# Patient Record
Sex: Female | Born: 1965 | Race: White | Hispanic: No | Marital: Married | Smoking: Former smoker
Health system: Southern US, Community
[De-identification: ages and names within clinical notes are randomized; demographics above are authoritative.]

## PROBLEM LIST (undated history)

## (undated) DIAGNOSIS — F32A Depression, unspecified: Secondary | ICD-10-CM

## (undated) DIAGNOSIS — F329 Major depressive disorder, single episode, unspecified: Secondary | ICD-10-CM

## (undated) DIAGNOSIS — F419 Anxiety disorder, unspecified: Secondary | ICD-10-CM

## (undated) HISTORY — PX: ABDOMINAL HYSTERECTOMY: SHX81

---
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
## 2006-06-09 ENCOUNTER — Emergency Department: Payer: Self-pay | Admitting: Unknown Physician Specialty

## 2008-01-31 ENCOUNTER — Emergency Department: Payer: Self-pay | Admitting: Emergency Medicine

## 2011-01-26 ENCOUNTER — Ambulatory Visit: Payer: Self-pay | Admitting: Family Medicine

## 2011-05-04 ENCOUNTER — Ambulatory Visit: Payer: Self-pay | Admitting: Gastroenterology

## 2011-05-05 ENCOUNTER — Ambulatory Visit: Payer: Self-pay | Admitting: Family Medicine

## 2011-05-06 LAB — PATHOLOGY REPORT

## 2011-07-26 ENCOUNTER — Ambulatory Visit: Payer: Self-pay | Admitting: Family Medicine

## 2012-08-03 ENCOUNTER — Ambulatory Visit: Payer: Self-pay | Admitting: Obstetrics and Gynecology

## 2014-03-04 ENCOUNTER — Ambulatory Visit: Payer: Self-pay | Admitting: Unknown Physician Specialty

## 2014-03-14 ENCOUNTER — Emergency Department: Payer: Self-pay | Admitting: Emergency Medicine

## 2014-03-14 LAB — COMPREHENSIVE METABOLIC PANEL
Albumin: 4 g/dL (ref 3.4–5.0)
Alkaline Phosphatase: 74 U/L
Anion Gap: 6 — ABNORMAL LOW (ref 7–16)
BUN: 8 mg/dL (ref 7–18)
Bilirubin,Total: 0.2 mg/dL (ref 0.2–1.0)
Calcium, Total: 8.8 mg/dL (ref 8.5–10.1)
Chloride: 108 mmol/L — ABNORMAL HIGH (ref 98–107)
Co2: 23 mmol/L (ref 21–32)
Creatinine: 1.03 mg/dL (ref 0.60–1.30)
EGFR (African American): >60
EGFR (Non-African Amer.): >60
Glucose: 143 mg/dL — ABNORMAL HIGH (ref 65–99)
Osmolality: 275 (ref 275–301)
Potassium: 4.2 mmol/L (ref 3.5–5.1)
SGOT(AST): 25 U/L (ref 15–37)
SGPT (ALT): 22 U/L (ref 12–78)
Sodium: 137 mmol/L (ref 136–145)
Total Protein: 7.8 g/dL (ref 6.4–8.2)

## 2014-03-14 LAB — URINALYSIS, COMPLETE
Bilirubin,UR: NEGATIVE
Blood: NEGATIVE
Glucose,UR: NEGATIVE mg/dL (ref 0–75)
Ketone: NEGATIVE
Leukocyte Esterase: NEGATIVE
Nitrite: NEGATIVE
Ph: 5 (ref 4.5–8.0)
Protein: NEGATIVE
RBC,UR: 2 /HPF (ref 0–5)
Specific Gravity: 1.02 (ref 1.003–1.030)
Squamous Epithelial: 6
WBC UR: 2 /HPF (ref 0–5)

## 2014-03-14 LAB — CBC WITH DIFFERENTIAL/PLATELET
Basophil #: 0.2 10*3/uL — ABNORMAL HIGH (ref 0.0–0.1)
Basophil %: 1.6 %
Eosinophil #: 0.1 10*3/uL (ref 0.0–0.7)
Eosinophil %: 0.6 %
HCT: 40.3 % (ref 35.0–47.0)
HGB: 13.6 g/dL (ref 12.0–16.0)
Lymphocyte #: 1.7 10*3/uL (ref 1.0–3.6)
Lymphocyte %: 13.1 %
MCH: 31.3 pg (ref 26.0–34.0)
MCHC: 33.7 g/dL (ref 32.0–36.0)
MCV: 93 fL (ref 80–100)
Monocyte #: 0.7 x10 3/mm (ref 0.2–0.9)
Monocyte %: 5.1 %
Neutrophil #: 10.3 10*3/uL — ABNORMAL HIGH (ref 1.4–6.5)
Neutrophil %: 79.6 %
Platelet: 373 10*3/uL (ref 150–440)
RBC: 4.34 10*6/uL (ref 3.80–5.20)
RDW: 13.5 % (ref 11.5–14.5)
WBC: 12.9 10*3/uL — ABNORMAL HIGH (ref 3.6–11.0)

## 2014-03-14 LAB — LIPASE, BLOOD: Lipase: 182 U/L (ref 73–393)

## 2014-05-08 ENCOUNTER — Ambulatory Visit: Payer: Self-pay | Admitting: Gynecologic Oncology

## 2014-05-18 ENCOUNTER — Emergency Department: Payer: Self-pay | Admitting: Emergency Medicine

## 2014-05-18 LAB — URINALYSIS, COMPLETE
Bilirubin,UR: NEGATIVE
Blood: NEGATIVE
Glucose,UR: NEGATIVE mg/dL (ref 0–75)
Ketone: NEGATIVE
Leukocyte Esterase: NEGATIVE
Nitrite: NEGATIVE
Ph: 5 (ref 4.5–8.0)
Protein: NEGATIVE
RBC,UR: 3 /HPF (ref 0–5)
Specific Gravity: 1.023 (ref 1.003–1.030)
Squamous Epithelial: 22
WBC UR: 5 /HPF (ref 0–5)

## 2014-05-18 LAB — COMPREHENSIVE METABOLIC PANEL
Albumin: 3.6 g/dL (ref 3.4–5.0)
Alkaline Phosphatase: 46 U/L
Anion Gap: 10 (ref 7–16)
BUN: 8 mg/dL (ref 7–18)
Bilirubin,Total: 0.4 mg/dL (ref 0.2–1.0)
Calcium, Total: 8.7 mg/dL (ref 8.5–10.1)
Chloride: 104 mmol/L (ref 98–107)
Co2: 24 mmol/L (ref 21–32)
Creatinine: 0.93 mg/dL (ref 0.60–1.30)
EGFR (African American): >60
EGFR (Non-African Amer.): >60
Glucose: 100 mg/dL — ABNORMAL HIGH (ref 65–99)
Osmolality: 274 (ref 275–301)
Potassium: 3.8 mmol/L (ref 3.5–5.1)
SGOT(AST): 18 U/L (ref 15–37)
SGPT (ALT): 16 U/L (ref 12–78)
Sodium: 138 mmol/L (ref 136–145)
Total Protein: 8 g/dL (ref 6.4–8.2)

## 2014-05-18 LAB — LIPASE, BLOOD: Lipase: 106 U/L (ref 73–393)

## 2014-05-18 LAB — CBC WITH DIFFERENTIAL/PLATELET
Basophil #: 0.1 10*3/uL (ref 0.0–0.1)
Basophil %: 0.5 %
Eosinophil #: 0.1 10*3/uL (ref 0.0–0.7)
Eosinophil %: 0.9 %
HCT: 40.1 % (ref 35.0–47.0)
HGB: 13.1 g/dL (ref 12.0–16.0)
Lymphocyte #: 2.6 10*3/uL (ref 1.0–3.6)
Lymphocyte %: 22.1 %
MCH: 31 pg (ref 26.0–34.0)
MCHC: 32.7 g/dL (ref 32.0–36.0)
MCV: 95 fL (ref 80–100)
Monocyte #: 0.7 x10 3/mm (ref 0.2–0.9)
Monocyte %: 5.9 %
Neutrophil #: 8.2 10*3/uL — ABNORMAL HIGH (ref 1.4–6.5)
Neutrophil %: 70.6 %
Platelet: 376 10*3/uL (ref 150–440)
RBC: 4.23 10*6/uL (ref 3.80–5.20)
RDW: 13.6 % (ref 11.5–14.5)
WBC: 11.6 10*3/uL — ABNORMAL HIGH (ref 3.6–11.0)

## 2014-05-20 ENCOUNTER — Inpatient Hospital Stay: Payer: Self-pay | Admitting: Obstetrics and Gynecology

## 2014-05-20 LAB — CBC WITH DIFFERENTIAL/PLATELET
Basophil #: 0.1 10*3/uL (ref 0.0–0.1)
Basophil %: 0.7 %
Eosinophil #: 0.1 10*3/uL (ref 0.0–0.7)
Eosinophil %: 0.8 %
HCT: 35.8 % (ref 35.0–47.0)
HGB: 11.5 g/dL — ABNORMAL LOW (ref 12.0–16.0)
Lymphocyte #: 2 10*3/uL (ref 1.0–3.6)
Lymphocyte %: 22.1 %
MCH: 31 pg (ref 26.0–34.0)
MCHC: 32.3 g/dL (ref 32.0–36.0)
MCV: 96 fL (ref 80–100)
Monocyte #: 0.7 x10 3/mm (ref 0.2–0.9)
Monocyte %: 7.8 %
Neutrophil #: 6.2 10*3/uL (ref 1.4–6.5)
Neutrophil %: 68.6 %
Platelet: 351 10*3/uL (ref 150–440)
RBC: 3.73 10*6/uL — ABNORMAL LOW (ref 3.80–5.20)
RDW: 13.6 % (ref 11.5–14.5)
WBC: 9.1 10*3/uL (ref 3.6–11.0)

## 2014-05-20 LAB — COMPREHENSIVE METABOLIC PANEL
Albumin: 3.3 g/dL — ABNORMAL LOW (ref 3.4–5.0)
Alkaline Phosphatase: 39 U/L — ABNORMAL LOW
Anion Gap: 7 (ref 7–16)
BUN: 6 mg/dL — ABNORMAL LOW (ref 7–18)
Bilirubin,Total: 0.2 mg/dL (ref 0.2–1.0)
Calcium, Total: 8.2 mg/dL — ABNORMAL LOW (ref 8.5–10.1)
Chloride: 108 mmol/L — ABNORMAL HIGH (ref 98–107)
Co2: 25 mmol/L (ref 21–32)
Creatinine: 0.9 mg/dL (ref 0.60–1.30)
EGFR (African American): >60
EGFR (Non-African Amer.): >60
Glucose: 74 mg/dL (ref 65–99)
Osmolality: 276 (ref 275–301)
Potassium: 3.6 mmol/L (ref 3.5–5.1)
SGOT(AST): 17 U/L (ref 15–37)
SGPT (ALT): 16 U/L (ref 12–78)
Sodium: 140 mmol/L (ref 136–145)
Total Protein: 7.3 g/dL (ref 6.4–8.2)

## 2014-05-21 LAB — URINALYSIS, COMPLETE
Bilirubin,UR: NEGATIVE
Glucose,UR: NEGATIVE mg/dL (ref 0–75)
Ketone: NEGATIVE
Nitrite: NEGATIVE
Ph: 5 (ref 4.5–8.0)
Protein: NEGATIVE
RBC,UR: 4 /HPF (ref 0–5)
Specific Gravity: 1.013 (ref 1.003–1.030)
Squamous Epithelial: 30
WBC UR: 24 /HPF (ref 0–5)

## 2014-05-21 LAB — CBC WITH DIFFERENTIAL/PLATELET
Basophil #: 0.1 10*3/uL (ref 0.0–0.1)
Basophil %: 0.5 %
Eosinophil #: 0.1 10*3/uL (ref 0.0–0.7)
Eosinophil %: 1.4 %
HCT: 32.7 % — ABNORMAL LOW (ref 35.0–47.0)
HGB: 10.9 g/dL — ABNORMAL LOW (ref 12.0–16.0)
Lymphocyte #: 1.9 10*3/uL (ref 1.0–3.6)
Lymphocyte %: 19.2 %
MCH: 31.8 pg (ref 26.0–34.0)
MCHC: 33.4 g/dL (ref 32.0–36.0)
MCV: 95 fL (ref 80–100)
Monocyte #: 0.7 x10 3/mm (ref 0.2–0.9)
Monocyte %: 7.3 %
Neutrophil #: 7.2 10*3/uL — ABNORMAL HIGH (ref 1.4–6.5)
Neutrophil %: 71.6 %
Platelet: 332 10*3/uL (ref 150–440)
RBC: 3.43 10*6/uL — ABNORMAL LOW (ref 3.80–5.20)
RDW: 13.3 % (ref 11.5–14.5)
WBC: 10.1 10*3/uL (ref 3.6–11.0)

## 2014-05-21 LAB — PREGNANCY, URINE: Pregnancy Test, Urine: NEGATIVE m[IU]/mL

## 2014-05-22 LAB — CBC WITH DIFFERENTIAL/PLATELET
Basophil #: 0 10*3/uL (ref 0.0–0.1)
Basophil %: 0.1 %
Eosinophil #: 0 10*3/uL (ref 0.0–0.7)
Eosinophil %: 0 %
HCT: 32.9 % — ABNORMAL LOW (ref 35.0–47.0)
HGB: 10.5 g/dL — ABNORMAL LOW (ref 12.0–16.0)
Lymphocyte #: 0.5 10*3/uL — ABNORMAL LOW (ref 1.0–3.6)
Lymphocyte %: 4.1 %
MCH: 30.6 pg (ref 26.0–34.0)
MCHC: 31.9 g/dL — ABNORMAL LOW (ref 32.0–36.0)
MCV: 96 fL (ref 80–100)
Monocyte #: 0.3 x10 3/mm (ref 0.2–0.9)
Monocyte %: 2.2 %
Neutrophil #: 11.1 10*3/uL — ABNORMAL HIGH (ref 1.4–6.5)
Neutrophil %: 93.6 %
Platelet: 329 10*3/uL (ref 150–440)
RBC: 3.43 10*6/uL — ABNORMAL LOW (ref 3.80–5.20)
RDW: 13 % (ref 11.5–14.5)
WBC: 11.9 10*3/uL — ABNORMAL HIGH (ref 3.6–11.0)

## 2014-05-22 LAB — BASIC METABOLIC PANEL
Anion Gap: 8 (ref 7–16)
BUN: 5 mg/dL — ABNORMAL LOW (ref 7–18)
Calcium, Total: 7.7 mg/dL — ABNORMAL LOW (ref 8.5–10.1)
Chloride: 99 mmol/L (ref 98–107)
Co2: 26 mmol/L (ref 21–32)
Creatinine: 0.82 mg/dL (ref 0.60–1.30)
EGFR (African American): >60
EGFR (Non-African Amer.): >60
Glucose: 151 mg/dL — ABNORMAL HIGH (ref 65–99)
Osmolality: 267 (ref 275–301)
Potassium: 3.9 mmol/L (ref 3.5–5.1)
Sodium: 133 mmol/L — ABNORMAL LOW (ref 136–145)

## 2014-05-22 LAB — HEMATOCRIT: HCT: 28.5 % — ABNORMAL LOW (ref 35.0–47.0)

## 2014-05-25 LAB — PATHOLOGY REPORT

## 2015-03-01 NOTE — Op Note (Signed)
PATIENT NAME:  Kathy Haynes, Kathy Haynes MR#:  [id number] DATE OF BIRTH:  02-16-1966  DATE OF PROCEDURE:  05/21/2014  PREOPERATIVE DIAGNOSES:  1. Pelvic pain. 2. Hematometra.   POSTOPERATIVE DIAGNOSES: 1. Pelvic pain. 2. Hematometra.   PROCEDURES: Total abdominal hysterectomy, bilateral salpingo-oophorectomy.   SURGEON: Dr. Feliberto Gottron.  FIRST ASSISTANT: Dr. Wendy Poet, GYN oncologist.   ANESTHESIA: General endotracheal anesthesia.   INDICATIONS: This is a 49 year old G2, P2, patient is status post a NovaSure endometrial ablation October 2014, was doing well until May when she started having pelvic pain. This pain crescendoed into an acute episode of 2 days before surgery with intractable abdominal pain. The patient's ultrasound showed a 20-cm uterus with a fluid-filled cavity consistent with a hematometra.  FINDINGS: A 20-weeks size uterus with a bulbous cervix.   PROCEDURE: After adequate general endotracheal anesthesia, the patient was placed in the dorsal supine position with the legs in the [place] stirrups. The patient's abdomen, perineum, and vagina were prepped and draped in a normal sterile fashion. A Foley catheter was placed into the bladder and drained 100 mL before the case commenced. The patient received 2 grams IV cefoxitin prior to commencement of the case. A vertical incision was made from the symphysis pubis to the umbilicus and then ultimately partially around the umbilicus. Fascia was identified and opened sharply. The peritoneal cavity was entered and the O'Connor-O'Sullivan retractor was initially placed with inadequate visibility noted. So therefore, the Bookwalter was brought up to the operative field. The uterus, approximately 20 weeks in size, bulbous with a bulbous cervix, 2 large Kelly clamps were placed on the uterine cornua and the left round ligament was grasped with a Babcock clamp and was suture ligated with 2 separate 0 Vicryl sutures. The round ligament was then  transected. Broad ligament was entered and the infundibulopelvic ligament was doubly clamped with curved Heaney Ballantine clamps. The pedicle was transected and the pedicle was then doubly ligated with 0 Vicryl suture. Good hemostasis was noted. There were some significant dense adhesions from the lower uterine segment to the uterus from prior cesarean sections. These scars and adhesions were taken down sharply. The patient's right round ligament was also doubly ligated and transected and an opening in the broad ligament was identified, clear of the ureter and the infundibulopelvic ligament was doubly clamped and transected, and doubly ligated with 0 Vicryl suture. The uterine arteries were skeletonized, and ultimately were identified and then clamped with curved Heaney clamps. The pedicle was transected and suture ligated with 0 Vicryl suture. Given the girth of the cervix and the depth of the pelvis, an EA dilator was placed in the vagina to identify the anterior fornix. Once this was done, the vagina was opened communicating from the EA dilator to the anterior fornix. Several straight Heaney clamps were used to continue dissecting towards this opening. All pedicles were ligated with 0 Vicryl suture. Ultimately, the vaginal cuff was created from the vaginal approach while putting traction on the uterus. Ultimately, the cervix and uterus were removed. Vaginal cuff was then closed with interrupted 0 Vicryl suture with good approximation of edges. Good hemostasis was noted. Ureters were identified in the retroperitoneum and had normal peristaltic activity. The abdomen was then copiously irrigated and suctioned. Good hemostasis was noted. All pedicle ties were removed. Arista was placed at the bladder base, central portion of the previously dissected vaginal tissues and in the retroperitoneum to ensure good hemostasis. All laparotomy sponges were removed from the patient's abdomen. Instruments were  removed and the  fascia was then closed with a 1 looped PDS suture. The fascia was brought together in the modified Smead Jones fashion and were tied <<together in the>> central portion of the incision. Subcutaneous tissues were irrigated and bovied, and the skin was reapproximated with staples.   ESTIMATED BLOOD LOSS: 750 mL.  INTRAOPERATIVE FLUIDS: 2800 mL.  URINE OUTPUT: 350 mL.  The patient tolerated the procedure well and was taken to the recovery room in good condition.     ____________________________ Kathy Bouchardhomas J. Korinne Greenstein, MD tjs:lt D: 05/21/2014 21:50:42 ET T: 05/22/2014 02:34:42 ET JOB#: [id number]  cc: Kathy Bouchardhomas J. Kinsey Karch, MD, <Dictator> Kathy BouchardHOMAS J Wissam Resor MD ELECTRONICALLY SIGNED 05/22/2014 21:33

## 2015-03-01 NOTE — H&P (Signed)
PATIENT NAME:  Kathy Haynes, Kathy Haynes MR#:  [id number] DATE OF BIRTH:  16-Jun-1966  DATE OF ADMISSION:  05/20/2014  HISTORY OF PRESENT ILLNESS: This is a 49 year old gravida 2, para 2 patient who [facility] on the date of admission with a 3-day history of increasing abdominal pelvic pain. The patient was seen in the Emergency Department on 05/18/2014 where a CT scan was performed and the patient was sent home with oral pain medication and follow[facility]. The patient's past is significant for history of endometrial ablation in October 2014 with followup thereafter without any specific complaints. The patient started  developing increasing pelvic pain in May 2015. She saw the nurse midwife, Milon ScoreCaron Jones, who performed an ultrasound on her that showed some endocervical dilation and it was again recommended that she follow up with me. The patient did not make a followup appointment with Dr. Feliberto GottronSchermerhorn. The patient returns after significant escalation in pain, not tolerating pain medications and is unable to get comfortable.   PAST MEDICAL HISTORY: Anxiety, ulcer, migraines, depression, hyperlipidemia.   PAST SURGICAL HISTORY:  Cesarean section x 2, endometrial ablation (NovaSure October 2014), wisdom tooth extraction.   FAMILY HISTORY: No gynecologic cancer.   SOCIAL HISTORY: She has quit smoking. Drinks alcohol. No illicit drug use.   ALLERGIES: AMOXICILLIN, MORPHINE, PENICILLIN AND SULFA.   CURRENT MEDICATIONS: Percocet, Motrin 800 mg every 8 hours, Phenergan 25 mg q.6 hours, Orth Tri-Cyclen 1 tablet daily.   REVIEW OF SYSTEMS: Unremarkable except for genitourinary history of menorrhagia, status post NovaSure ablation.   PHYSICAL EXAMINATION: GENERAL: Well-developed, well-nourished, ill-appearing female.  VITAL SIGNS:  BMI is 34. Blood pressure is 151/87, pulse 91.  LUNGS: Clear to auscultation.  CARDIOVASCULAR: Regular rate and rhythm.  ABDOMEN: Soft, palpable  mass 2 cm above the umbilicus, tender to palpation. No rebound tenderness.  PELVIC: External genitalia Tanner stage V.  Vulva and labia n lesions. Vagina normal physiologic discharge. There is a bulbous cervix filling the upper vagina. The uterus is approximately 20 weeks in size, extremely tender. Adnexa no palpable mass but the size of the uterus precludes adequate exam. Rectovaginal exam, she cannot accommodate a digital finger.   IMAGING:  Ultrasound performed by (Dictation Anomaly) <<Glenda Rooney>, ultrasonographer at [facility] on the day of admission. Abdominal and pelvic ultrasound: The uterus measures 20.5 x 10.8 x 8.1 cm. There is a copious amount of heterogenic material in the endocervical and endometrial area with the width measuring 9 x 7 cm.   ASSESSMENT:  1.  Acute onset of abdominal pain.  2.  Hematometra.   PLAN: Given the rapid onset of enlargement and the size of the hematometra, I am admitting the patient for pain management and recommended a total abdominal hysterectomy, bilateral salpingo-oophorectomy that will be performed on the 14th. Given the possibility that this is a cancerous process, Dr. Wendy PoetBrigitte Miller, GYN oncologist will be co-surgeon and will have a better estimate at the time of surgery. Otherwise, metabolic panel and CBC will be ordered. Morphine PCA pump will be ordered as well. The patient will be n.p.o. after 2400.     ____________________________ Suzy Bouchardhomas J. Breslin Hemann, MD tjs:cs D: 05/20/2014 14:14:45 ET T: 05/20/2014 15:33:28 ET JOB#: [id number]  cc: Suzy Bouchardhomas J. Aasim Restivo, MD, <Dictator> Suzy BouchardHOMAS J Yittel Emrich MD ELECTRONICALLY SIGNED 05/22/2014 21:33

## 2015-09-02 ENCOUNTER — Other Ambulatory Visit: Payer: Self-pay | Admitting: Obstetrics and Gynecology

## 2015-09-02 DIAGNOSIS — Z1231 Encounter for screening mammogram for malignant neoplasm of breast: Secondary | ICD-10-CM

## 2015-09-04 ENCOUNTER — Ambulatory Visit: Payer: BC Managed Care – PPO

## 2015-09-11 ENCOUNTER — Other Ambulatory Visit: Payer: Self-pay | Admitting: Obstetrics and Gynecology

## 2015-09-11 ENCOUNTER — Ambulatory Visit
Admission: RE | Admit: 2015-09-11 | Discharge: 2015-09-11 | Disposition: A | Discharge status: Home / Self Care | Payer: BC Managed Care – PPO | Source: Ambulatory Visit | Attending: Obstetrics and Gynecology | Admitting: Obstetrics and Gynecology

## 2015-09-11 DIAGNOSIS — M79605 Pain in left leg: Secondary | ICD-10-CM

## 2017-08-17 ENCOUNTER — Other Ambulatory Visit: Payer: Self-pay | Admitting: Family Medicine

## 2017-08-17 DIAGNOSIS — Z1231 Encounter for screening mammogram for malignant neoplasm of breast: Secondary | ICD-10-CM

## 2018-02-14 ENCOUNTER — Other Ambulatory Visit: Payer: Self-pay

## 2018-02-14 DIAGNOSIS — L03115 Cellulitis of right lower limb: Secondary | ICD-10-CM | POA: Insufficient documentation

## 2018-02-14 DIAGNOSIS — L02415 Cutaneous abscess of right lower limb: Secondary | ICD-10-CM | POA: Diagnosis present

## 2018-02-14 NOTE — ED Triage Notes (Signed)
Pt in with abscess to right inner thigh x 2 days, hx of the same but states never this large. Did drain some today after taking shower.

## 2018-02-15 ENCOUNTER — Emergency Department
Admission: EM | Admit: 2018-02-15 | Discharge: 2018-02-15 | Disposition: A | Discharge status: Home / Self Care | Payer: BLUE CROSS/BLUE SHIELD | Attending: Emergency Medicine | Admitting: Emergency Medicine

## 2018-02-15 DIAGNOSIS — L03115 Cellulitis of right lower limb: Secondary | ICD-10-CM

## 2018-02-15 MED ORDER — LIDOCAINE HCL (PF) 1 % IJ SOLN
INTRAMUSCULAR | Status: AC
  Administered 2018-02-15: 5 mL via INTRADERMAL
  Filled 2018-02-15: qty 5

## 2018-02-15 MED ORDER — LIDOCAINE HCL (PF) 1 % IJ SOLN
5.0000 mL | Freq: Once | INTRAMUSCULAR | Status: AC
  Administered 2018-02-15: 5 mL via INTRADERMAL

## 2018-02-15 MED ORDER — OXYCODONE-ACETAMINOPHEN 5-325 MG PO TABS
ORAL_TABLET | ORAL | Status: AC
  Administered 2018-02-15: 1 via ORAL
  Filled 2018-02-15: qty 1

## 2018-02-15 MED ORDER — CLINDAMYCIN HCL 150 MG PO CAPS
300.0000 mg | ORAL_CAPSULE | Freq: Once | ORAL | Status: AC
  Administered 2018-02-15: 300 mg via ORAL
  Filled 2018-02-15: qty 2

## 2018-02-15 MED ORDER — OXYCODONE-ACETAMINOPHEN 5-325 MG PO TABS
1.0000 | ORAL_TABLET | Freq: Once | ORAL | Status: AC
  Administered 2018-02-15: 1 via ORAL

## 2018-02-15 MED ORDER — OXYCODONE-ACETAMINOPHEN 5-325 MG PO TABS: 1.0000 | ORAL_TABLET | ORAL | 0 refills | Status: AC | PRN

## 2018-02-15 MED ORDER — PENTAFLUOROPROP-TETRAFLUOROETH EX AERO
INHALATION_SPRAY | CUTANEOUS | Status: AC
  Administered 2018-02-15: 03:00:00
  Filled 2018-02-15: qty 30

## 2018-02-15 MED ORDER — CLINDAMYCIN HCL 300 MG PO CAPS: 300.0000 mg | ORAL_CAPSULE | Freq: Three times a day (TID) | ORAL | 0 refills | Status: AC

## 2018-02-15 NOTE — ED Provider Notes (Signed)
[facility] Emergency Department Provider Note   First MD Initiated Contact with Patient 02/15/18 0200     (approximate)  I have reviewed the triage vital signs and the nursing notes.   HISTORY  Chief Complaint Abscess    HPI Kathy Haynes is a 52 y.o. female with below list of chronic medical conditions presents to the emergency department with an abscess on the right inner thigh for 2 days.  Patient states that there is increasing discomfort and redness and is markedly tender at this time with a pain of 10 out of 10.  Patient denies any fever  Past medical history  There are no active problems to display for this patient.     Prior to Admission medications   Not on File    Allergies Penicillins and Sulfa antibiotics  No family history on file.  Social History Social History   Tobacco Use  . Smoking status: Not on file  Substance Use Topics  . Alcohol use: Not on file  . Drug use: Not on file    Review of Systems Constitutional: No fever/chills Eyes: No visual changes. ENT: No sore throat. Cardiovascular: Denies chest pain. Respiratory: Denies shortness of breath. Gastrointestinal: No abdominal pain.  No nausea, no vomiting.  No diarrhea.  No constipation. Genitourinary: Negative for dysuria. Musculoskeletal: Negative for neck pain.  Negative for back pain. Integumentary: Positive for possible right inner thigh abscess. Neurological: Negative for headaches, focal weakness or numbness.   ____________________________________________   PHYSICAL EXAM:  VITAL SIGNS: ED Triage Vitals  Enc Vitals Group     BP 02/14/18 2200 (!) 141/82     Pulse Rate 02/14/18 2200 87     Resp 02/14/18 2200 20     Temp 02/14/18 2200 98.7 F (37.1 C)     Temp Source 02/14/18 2200 Oral     SpO2 02/14/18 2200 97 %     Weight 02/14/18 2201 78 kg (172 lb)     Height 02/14/18 2201 1.524 m (5')     Head Circumference --      Peak Flow --    Pain Score 02/14/18 2201 0     Pain Loc --      Pain Edu? --      Excl. in [place]? --     Constitutional: Alert and oriented. Well appearing and in no acute distress. Eyes: Conjunctivae are normal.  Head: Atraumatic. Mouth/Throat: Mucous membranes are moist. Oropharynx non-erythematous. Neck: No stridor.   Cardiovascular: Normal rate, regular rhythm. Good peripheral circulation. Grossly normal heart sounds. Respiratory: Normal respiratory effort.  No retractions. Lungs CTAB. Gastrointestinal: Soft and nontender. No distention.  Musculoskeletal: No lower extremity tenderness nor edema. No gross deformities of extremities. Neurologic:  Normal speech and language. No gross focal neurologic deficits are appreciated.  Skin: 9 x 7 cm area of blanching erythema tender to touch and warm right inner thigh.   _____________________ .Marland KitchenIncision and Drainage Date/Time: 02/15/2018 2:40 AM Performed by: Darci Current, MD Authorized by: Darci Current, MD   Consent:    Consent obtained:  Verbal   Consent given by:  Patient   Risks discussed:  Bleeding, infection, incomplete drainage and pain   Alternatives discussed:  Alternative treatment, delayed treatment and observation Location:    Type:  Abscess   Location:  Lower extremity   Lower extremity location:  Leg   Leg location:  R upper leg Pre-procedure details:    Skin preparation:  Betadine Anesthesia (see  MAR for exact dosages):    Anesthesia method:  Local infiltration   Local anesthetic:  Lidocaine 1% w/o epi Procedure type:    Complexity:  Complex Procedure details:    Needle aspiration: yes     Needle size:  18 G   Drainage:  Serosanguinous and bloody Post-procedure details:    Patient tolerance of procedure:  Tolerated well, no immediate complications     ____________________________________________   INITIAL IMPRESSION / ASSESSMENT AND PLAN / ED COURSE  As part of my medical decision making, I reviewed the  following data within the electronic MEDICAL RECORD NUMBER   52 year old female presented with above-stated history and physical exam concerning for right inner thigh cellulitis versus abscess.  Needle aspiration was performed which revealed no evidence of purulent drainage.  Patient given clindamycin and Percocet in the emergency department will be prescribed same for home spoke with the patient at length regarding warning signs that would warrant return to the emergency department ____________________________________________  FINAL CLINICAL IMPRESSION(S) / ED DIAGNOSES  Final diagnoses:  Cellulitis of right lower extremity     MEDICATIONS GIVEN DURING THIS VISIT:  Medications  oxyCODONE-acetaminophen (PERCOCET/ROXICET) 5-325 MG per tablet 1 tablet (1 tablet Oral Given 02/15/18 0156)  clindamycin (CLEOCIN) capsule 300 mg (300 mg Oral Given 02/15/18 0156)  lidocaine (PF) (XYLOCAINE) 1 % injection 5 mL (5 mLs Intradermal Given 02/15/18 0155)     ED Discharge Orders    None       Note:  This document was prepared using Dragon voice recognition software and may include unintentional dictation errors.    Darci CurrentBrown, [place] N, MD 02/15/18 [phone]

## 2018-02-15 NOTE — ED Notes (Signed)
Dr. Manson PasseyBrown is at bedside draining the Pt's abscess.

## 2018-02-16 ENCOUNTER — Other Ambulatory Visit: Payer: Self-pay | Admitting: Family Medicine

## 2018-02-16 DIAGNOSIS — N644 Mastodynia: Secondary | ICD-10-CM

## 2018-02-24 ENCOUNTER — Ambulatory Visit
Admission: RE | Admit: 2018-02-24 | Discharge: 2018-02-24 | Disposition: A | Discharge status: Home / Self Care | Payer: BLUE CROSS/BLUE SHIELD | Source: Ambulatory Visit | Attending: Family Medicine | Admitting: Family Medicine

## 2018-02-24 ENCOUNTER — Ambulatory Visit: Payer: BLUE CROSS/BLUE SHIELD

## 2018-02-24 DIAGNOSIS — N644 Mastodynia: Secondary | ICD-10-CM | POA: Diagnosis not present

## 2019-08-14 ENCOUNTER — Emergency Department: Payer: BC Managed Care – PPO

## 2019-08-14 ENCOUNTER — Encounter: Payer: Self-pay | Admitting: *Deleted

## 2019-08-14 ENCOUNTER — Other Ambulatory Visit: Payer: Self-pay

## 2019-08-14 ENCOUNTER — Emergency Department
Admission: EM | Admit: 2019-08-14 | Discharge: 2019-08-14 | Disposition: A | Discharge status: Home / Self Care | Payer: BC Managed Care – PPO | Attending: Emergency Medicine | Admitting: Emergency Medicine

## 2019-08-14 DIAGNOSIS — R079 Chest pain, unspecified: Secondary | ICD-10-CM | POA: Insufficient documentation

## 2019-08-14 DIAGNOSIS — F17200 Nicotine dependence, unspecified, uncomplicated: Secondary | ICD-10-CM | POA: Insufficient documentation

## 2019-08-14 HISTORY — DX: Anxiety disorder, unspecified: F41.9

## 2019-08-14 HISTORY — DX: Depression, unspecified: F32.A

## 2019-08-14 LAB — TROPONIN I (HIGH SENSITIVITY)
Troponin I (High Sensitivity): <2 ng/L (ref <18)
Troponin I (High Sensitivity): <2 ng/L (ref <18)

## 2019-08-14 LAB — CBC
HCT: 40.5 % (ref 36.0–46.0)
Hemoglobin: 13.3 g/dL (ref 12.0–15.0)
MCH: 30.6 pg (ref 26.0–34.0)
MCHC: 32.8 g/dL (ref 30.0–36.0)
MCV: 93.3 fL (ref 80.0–100.0)
Platelets: 343 10*3/uL (ref 150–400)
RBC: 4.34 MIL/uL (ref 3.87–5.11)
RDW: 12.4 % (ref 11.5–15.5)
WBC: 7.4 10*3/uL (ref 4.0–10.5)
nRBC: 0 % (ref 0.0–0.2)

## 2019-08-14 LAB — BASIC METABOLIC PANEL
Anion gap: 10 (ref 5–15)
BUN: 13 mg/dL (ref 6–20)
CO2: 24 mmol/L (ref 22–32)
Calcium: 9.2 mg/dL (ref 8.9–10.3)
Chloride: 104 mmol/L (ref 98–111)
Creatinine, Ser: 0.77 mg/dL (ref 0.44–1.00)
GFR calc Af Amer: >60 mL/min (ref >60)
GFR calc non Af Amer: >60 mL/min (ref >60)
Glucose, Bld: 109 mg/dL — ABNORMAL HIGH (ref 70–99)
Potassium: 4.1 mmol/L (ref 3.5–5.1)
Sodium: 138 mmol/L (ref 135–145)

## 2019-08-14 MED ORDER — IOHEXOL 350 MG/ML SOLN
75.0000 mL | Freq: Once | INTRAVENOUS | Status: AC | PRN
  Administered 2019-08-14: 17:00:00 75 mL via INTRAVENOUS

## 2019-08-14 NOTE — ED Provider Notes (Signed)
[facility] Emergency Department Provider Note  Time seen: 4:26 PM  I have reviewed the triage vital signs and the nursing notes.   HISTORY  Chief Complaint Chest Pain   HPI ALIZAYA OSHEA is a 53 y.o. female with a past medical history of anxiety, depression, presents to the emergency department for chest pain.  According to the patient around 830 this morning she developed a sharp pain in her left chest and felt like her heart was racing.  Patient states the pain went away however it has recurred intermittently especially if she moves a certain way or takes a deep breath.  Denies any nausea or diaphoresis.  No shortness of breath.  No cough or fever.  Past Medical History:  Diagnosis Date  . Anxiety   . Depression     There are no active problems to display for this patient.   Past Surgical History:  Procedure Laterality Date  . ABDOMINAL HYSTERECTOMY    . CESAREAN SECTION     x2    Prior to Admission medications   Not on File    Allergies  Allergen Reactions  . Penicillins Hives  . Sulfa Antibiotics Hives    Family History  Problem Relation Age of Onset  . Breast cancer Paternal Aunt     Social History Social History   Tobacco Use  . Smoking status: Current Every Day Smoker  . Smokeless tobacco: Never Used  Substance Use Topics  . Alcohol use: Yes    Comment: occasionally  . Drug use: Never    Review of Systems Constitutional: Negative for fever. Cardiovascular: Intermittent mild sharp chest pain on the left side. Respiratory: Negative for shortness of breath.  Negative for cough. Gastrointestinal: Negative for abdominal pain, vomiting Musculoskeletal: Negative for musculoskeletal complaints Neurological: Negative for headache All other ROS negative  ____________________________________________   PHYSICAL EXAM:  VITAL SIGNS: ED Triage Vitals  Enc Vitals Group     BP 08/14/19 1448 137/83     Pulse Rate 08/14/19  1448 60     Resp 08/14/19 1448 16     Temp 08/14/19 1448 98.4 F (36.9 C)     Temp Source 08/14/19 1448 Oral     SpO2 --      Weight 08/14/19 1444 170 lb (77.1 kg)     Height 08/14/19 1444 5' (1.524 m)     Head Circumference --      Peak Flow --      Pain Score 08/14/19 1444 5     Pain Loc --      Pain Edu? --      Excl. in [place]? --    Constitutional: Alert and oriented. Well appearing and in no distress. Eyes: Normal exam ENT      Head: Normocephalic and atraumatic.      Mouth/Throat: Mucous membranes are moist. Cardiovascular: Normal rate, regular rhythm. Respiratory: Normal respiratory effort without tachypnea nor retractions. Breath sounds are clear  Gastrointestinal: Soft and nontender. No distention.   Musculoskeletal: Nontender with normal range of motion in all extremities.  Neurologic:  Normal speech and language. No gross focal neurologic deficits Skin:  Skin is warm, dry and intact.  Psychiatric: Mood and affect are normal.   ____________________________________________    EKG  EKG viewed and interpreted by myself shows a normal sinus rhythm at 61 bpm with a narrow QRS, normal axis, normal intervals, no concerning ST changes.  ____________________________________________    RADIOLOGY  CT angiography is negative for  PE.  There is a 5 mm node.  ____________________________________________   INITIAL IMPRESSION / ASSESSMENT AND PLAN / ED COURSE  Pertinent labs & imaging results that were available during my care of the patient were reviewed by me and considered in my medical decision making (see chart for details).   Patient presents to the emergency department for chest pain since 830 this morning.  Differential would include musculoskeletal pain, chest wall pain, ACS, PE.  Patient does states she takes estrogen therapy and has noticed some pain in her calfs intermittently over the past several months.  We will proceed with CT angiography of the chest as a  precaution.  Patient's lab work is reassuring including a negative troponin.  Denies any discomfort at this time.  CT scan is negative.  Labs are largely within normal limits including negative troponin.  Overall the patient appears very well.  I discussed the 5 mm nodule with the patient she will follow-up with her doctor.  DARCUS EDDS was evaluated in Emergency Department on 08/14/2019 for the symptoms described in the history of present illness. She was evaluated in the context of the global COVID-19 pandemic, which necessitated consideration that the patient might be at risk for infection with the SARS-CoV-2 virus that causes COVID-19. Institutional protocols and algorithms that pertain to the evaluation of patients at risk for COVID-19 are in a state of rapid change based on information released by regulatory bodies including the CDC and federal and state organizations. These policies and algorithms were followed during the patient's care in the ED.  ____________________________________________   FINAL CLINICAL IMPRESSION(S) / ED DIAGNOSES  Chest pain   Minna Antis, MD 08/14/19 1724

## 2019-08-14 NOTE — ED Triage Notes (Signed)
Pt reports chest tightness for the past couple hours. PT reports feeling her heartbeat which is abnormal for her. Pain located on left side of ribs and under breasts. Pressure reportedly decreases pain but not by much. Lightheadedness intermittently but no SOB or other symptoms at this time.   No cardiac hx.

## 2019-08-14 NOTE — ED Notes (Signed)
Patient transported to CT 

## 2019-12-20 ENCOUNTER — Other Ambulatory Visit: Payer: Self-pay | Admitting: Family Medicine

## 2019-12-20 DIAGNOSIS — Z1231 Encounter for screening mammogram for malignant neoplasm of breast: Secondary | ICD-10-CM

## 2020-03-12 ENCOUNTER — Other Ambulatory Visit: Payer: Self-pay | Admitting: Family Medicine

## 2020-03-12 DIAGNOSIS — Z1231 Encounter for screening mammogram for malignant neoplasm of breast: Secondary | ICD-10-CM

## 2020-05-14 ENCOUNTER — Ambulatory Visit
Admission: RE | Admit: 2020-05-14 | Discharge: 2020-05-14 | Disposition: A | Discharge status: Home / Self Care | Payer: BC Managed Care – PPO | Source: Ambulatory Visit | Attending: Family Medicine | Admitting: Family Medicine

## 2020-05-14 ENCOUNTER — Other Ambulatory Visit: Payer: Self-pay

## 2020-05-14 DIAGNOSIS — Z1231 Encounter for screening mammogram for malignant neoplasm of breast: Secondary | ICD-10-CM | POA: Insufficient documentation

## 2020-08-20 ENCOUNTER — Other Ambulatory Visit: Payer: Self-pay | Admitting: Family Medicine

## 2020-08-20 ENCOUNTER — Other Ambulatory Visit (HOSPITAL_COMMUNITY): Payer: Self-pay | Admitting: Family Medicine

## 2020-08-20 DIAGNOSIS — R911 Solitary pulmonary nodule: Secondary | ICD-10-CM

## 2020-08-28 ENCOUNTER — Other Ambulatory Visit: Payer: Self-pay

## 2020-08-28 ENCOUNTER — Ambulatory Visit
Admission: RE | Admit: 2020-08-28 | Discharge: 2020-08-28 | Disposition: A | Discharge status: Home / Self Care | Payer: BC Managed Care – PPO | Source: Ambulatory Visit | Attending: Family Medicine | Admitting: Family Medicine

## 2020-08-28 DIAGNOSIS — R911 Solitary pulmonary nodule: Secondary | ICD-10-CM | POA: Insufficient documentation

## 2021-01-20 IMAGING — MG DIGITAL SCREENING BILAT W/ TOMO W/ CAD
8 series · 8 of 24 positions shown · non-contrast
Comparison: Previous exam(s).

CLINICAL DATA: Screening.

EXAM:
DIGITAL SCREENING BILATERAL MAMMOGRAM WITH TOMO AND CAD

[R CC synth-2D]
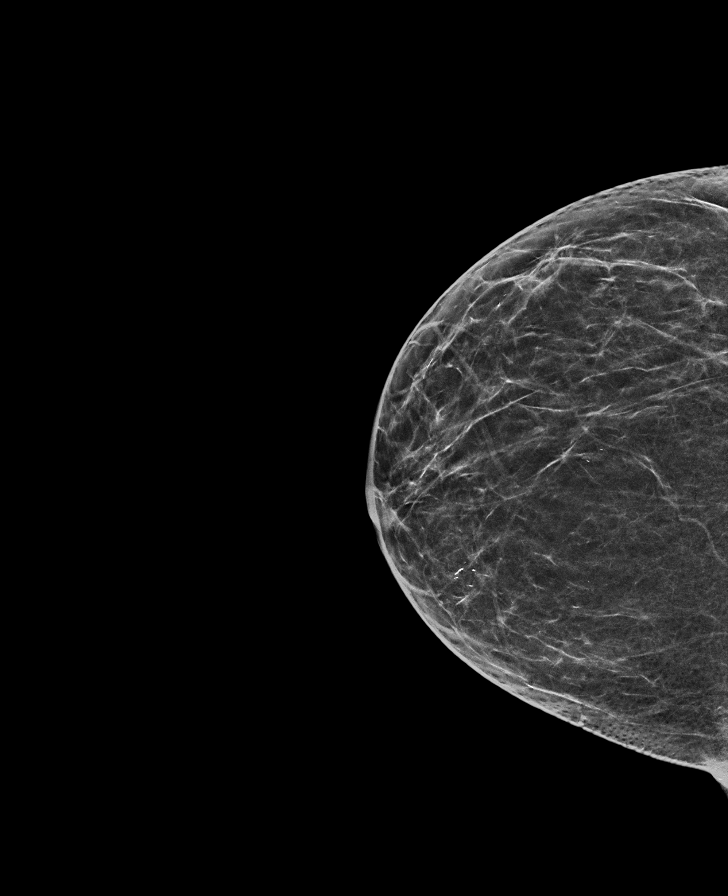

[R MLO synth-2D]
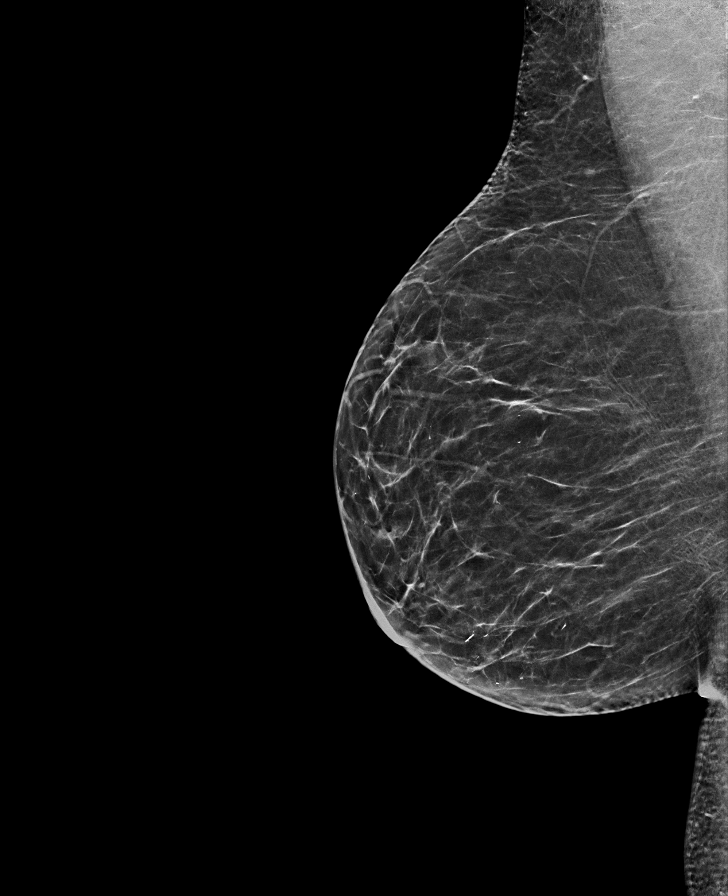

[L CC synth-2D]
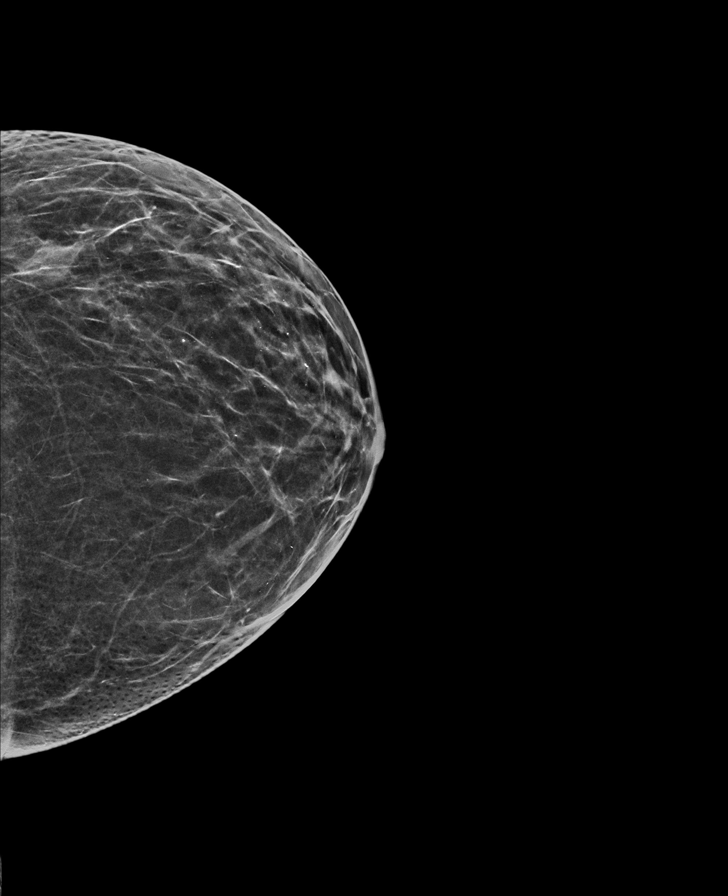

[L MLO synth-2D]
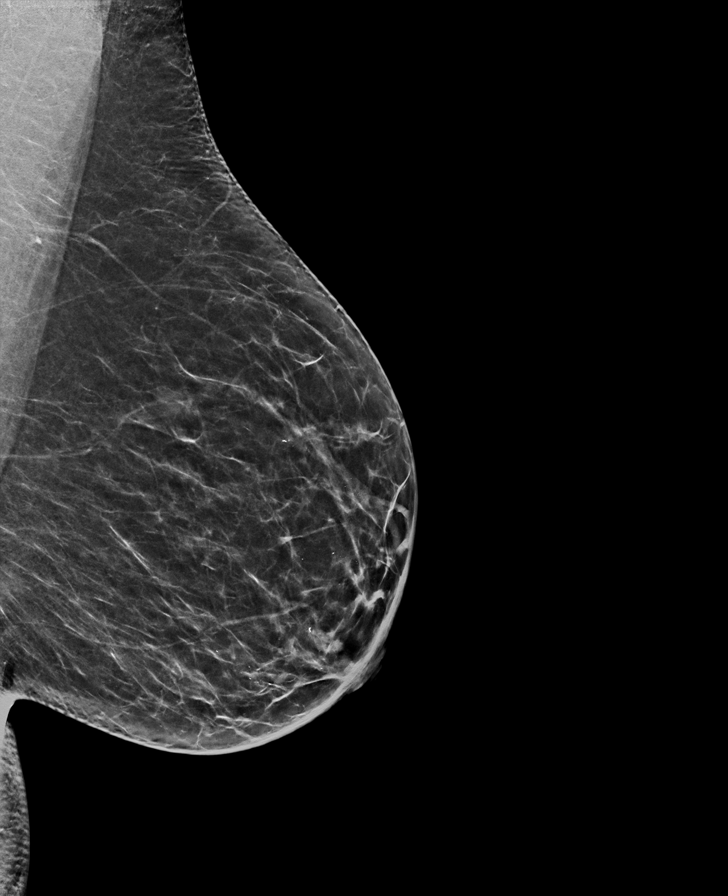

[L MLO tomo · tomo slice 33/64.0]
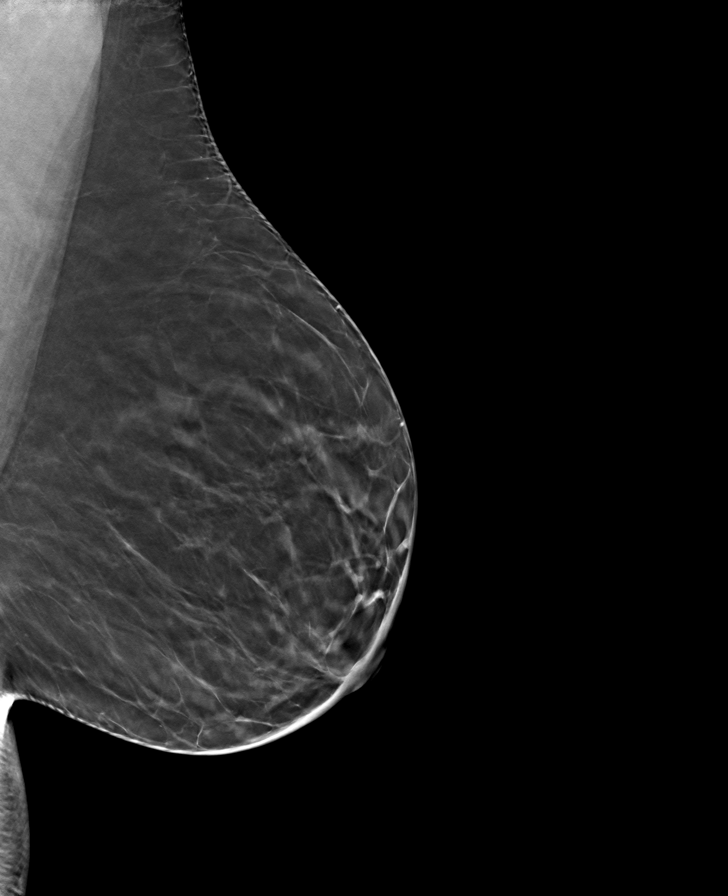

[R CC tomo · tomo slice 29/57.0]
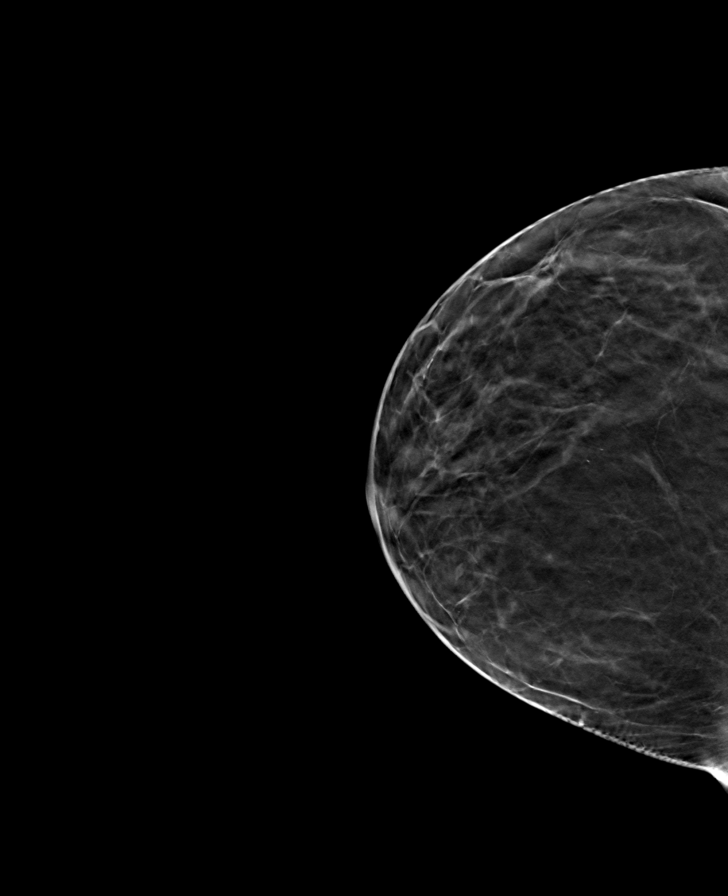

[L CC tomo · tomo slice 30/59.0]
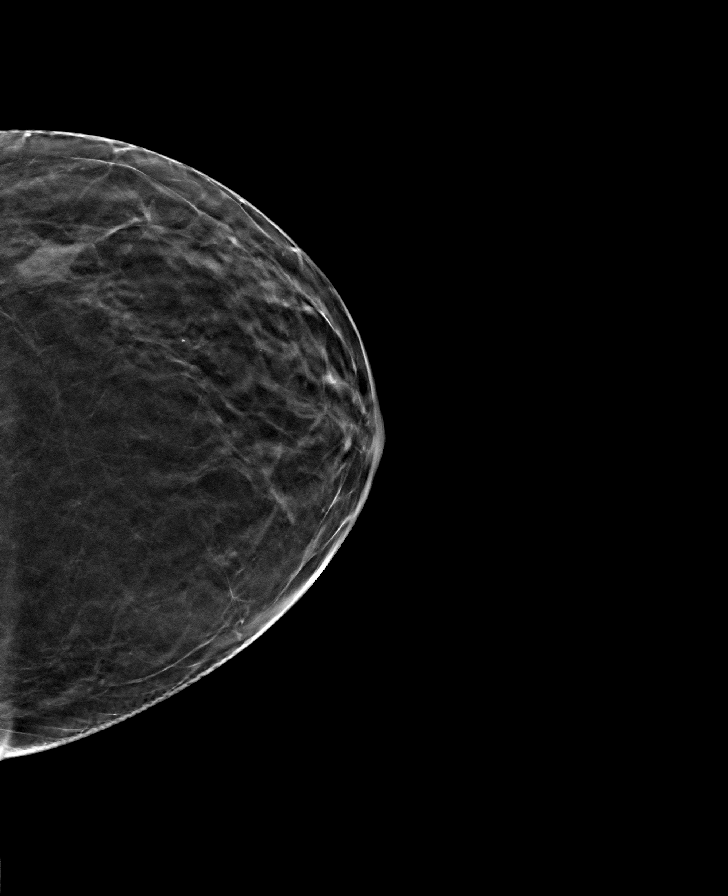

[R MLO tomo · tomo slice 34/67.0]
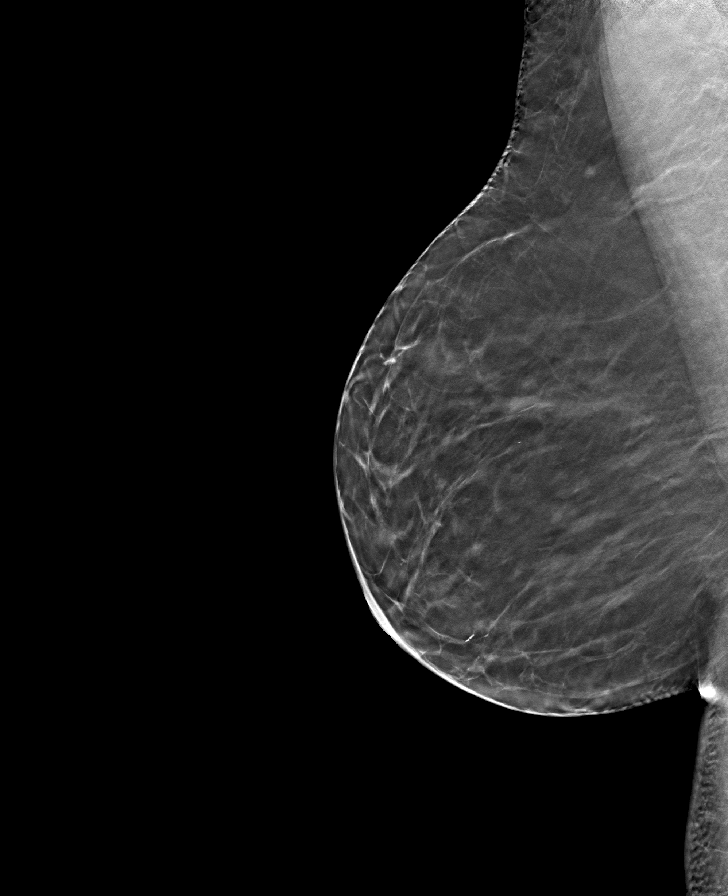

[8 of 24 positions shown; findings below may reference images not displayed]

ACR Breast Density Category b: There are scattered areas of
fibroglandular density.
FINDINGS: There are no findings suspicious for malignancy. Images were
processed with CAD.
IMPRESSION: No mammographic evidence of malignancy. A result letter of this
screening mammogram will be mailed directly to the patient.

RECOMMENDATION:
Screening mammogram in one year. (Code:[id number])

BI-RADS CATEGORY  1: Negative.

## 2022-12-10 ENCOUNTER — Other Ambulatory Visit: Payer: Self-pay | Admitting: Orthopedic Surgery

## 2022-12-13 ENCOUNTER — Encounter: Payer: Self-pay | Admitting: Orthopedic Surgery

## 2022-12-17 ENCOUNTER — Other Ambulatory Visit: Payer: Self-pay

## 2022-12-17 ENCOUNTER — Ambulatory Visit: Payer: BC Managed Care – PPO | Admitting: Anesthesiology

## 2022-12-17 ENCOUNTER — Ambulatory Visit
Admission: RE | Admit: 2022-12-17 | Discharge: 2022-12-17 | Disposition: A | Discharge status: Home / Self Care | Payer: BC Managed Care – PPO | Attending: Orthopedic Surgery | Admitting: Orthopedic Surgery

## 2022-12-17 ENCOUNTER — Encounter: Payer: Self-pay | Admitting: Orthopedic Surgery

## 2022-12-17 ENCOUNTER — Encounter
Admission: RE | Disposition: A | Discharge status: Home / Self Care | Payer: Self-pay | Source: Home / Self Care | Attending: Orthopedic Surgery

## 2022-12-17 DIAGNOSIS — M19012 Primary osteoarthritis, left shoulder: Secondary | ICD-10-CM | POA: Diagnosis not present

## 2022-12-17 DIAGNOSIS — M659 Synovitis and tenosynovitis, unspecified: Secondary | ICD-10-CM | POA: Diagnosis not present

## 2022-12-17 DIAGNOSIS — M25812 Other specified joint disorders, left shoulder: Secondary | ICD-10-CM | POA: Diagnosis not present

## 2022-12-17 DIAGNOSIS — M75102 Unspecified rotator cuff tear or rupture of left shoulder, not specified as traumatic: Secondary | ICD-10-CM | POA: Insufficient documentation

## 2022-12-17 SURGERY — SHOULDER ARTHROSCOPY WITH SUBACROMIAL DECOMPRESSION AND DISTAL CLAVICLE EXCISION
Anesthesia: General | Laterality: Left

## 2022-12-17 MED ORDER — LACTATED RINGERS IV SOLN: INTRAVENOUS | Status: DC

## 2022-12-17 MED ORDER — FENTANYL CITRATE PF 50 MCG/ML IJ SOSY: 25.0000 ug | PREFILLED_SYRINGE | INTRAMUSCULAR | Status: DC | PRN

## 2022-12-17 MED ORDER — ONDANSETRON HCL 4 MG/2ML IJ SOLN
INTRAMUSCULAR | Status: DC | PRN
  Administered 2022-12-17: 4 mg via INTRAVENOUS

## 2022-12-17 MED ORDER — ACETAMINOPHEN 500 MG PO TABS: 1000.0000 mg | ORAL_TABLET | Freq: Three times a day (TID) | ORAL | 2 refills | Status: AC

## 2022-12-17 MED ORDER — DEXMEDETOMIDINE HCL IN NACL 400 MCG/100ML IV SOLN
INTRAVENOUS | Status: DC | PRN
  Administered 2022-12-17 (×3): 4 ug via INTRAVENOUS
  Administered 2022-12-17: 8 ug via INTRAVENOUS

## 2022-12-17 MED ORDER — LACTATED RINGERS IR SOLN
Status: DC | PRN
  Administered 2022-12-17: 18000 mL

## 2022-12-17 MED ORDER — SUCCINYLCHOLINE CHLORIDE 200 MG/10ML IV SOSY
PREFILLED_SYRINGE | INTRAVENOUS | Status: DC | PRN
  Administered 2022-12-17: 100 mg via INTRAVENOUS

## 2022-12-17 MED ORDER — ASPIRIN 325 MG PO TBEC: 325.0000 mg | DELAYED_RELEASE_TABLET | Freq: Every day | ORAL | 0 refills | Status: AC

## 2022-12-17 MED ORDER — BUPIVACAINE HCL (PF) 0.5 % IJ SOLN
INTRAMUSCULAR | Status: DC | PRN
  Administered 2022-12-17: 10 mL via PERINEURAL

## 2022-12-17 MED ORDER — LACTATED RINGERS IV SOLN
INTRAVENOUS | Status: DC | PRN
  Administered 2022-12-17: 12000 mL

## 2022-12-17 MED ORDER — ACETAMINOPHEN 10 MG/ML IV SOLN
INTRAVENOUS | Status: DC | PRN
  Administered 2022-12-17: 1000 mg via INTRAVENOUS

## 2022-12-17 MED ORDER — BUPIVACAINE LIPOSOME 1.3 % IJ SUSP
INTRAMUSCULAR | Status: DC | PRN
  Administered 2022-12-17: 20 mL via PERINEURAL

## 2022-12-17 MED ORDER — PROPOFOL 10 MG/ML IV BOLUS
INTRAVENOUS | Status: DC | PRN
  Administered 2022-12-17: 140 mg via INTRAVENOUS

## 2022-12-17 MED ORDER — MIDAZOLAM HCL 5 MG/5ML IJ SOLN
INTRAMUSCULAR | Status: DC | PRN
  Administered 2022-12-17: 1 mg via INTRAVENOUS

## 2022-12-17 MED ORDER — OXYCODONE HCL 5 MG PO TABS
5.0000 mg | ORAL_TABLET | Freq: Once | ORAL | Status: AC | PRN
  Administered 2022-12-17: 5 mg via ORAL

## 2022-12-17 MED ORDER — DEXAMETHASONE SODIUM PHOSPHATE 4 MG/ML IJ SOLN
INTRAMUSCULAR | Status: DC | PRN
  Administered 2022-12-17: 4 mg via INTRAVENOUS

## 2022-12-17 MED ORDER — CEFAZOLIN SODIUM-DEXTROSE 2-4 GM/100ML-% IV SOLN
2.0000 g | INTRAVENOUS | Status: AC
  Administered 2022-12-17: 2 g via INTRAVENOUS

## 2022-12-17 MED ORDER — EPHEDRINE SULFATE (PRESSORS) 50 MG/ML IJ SOLN
INTRAMUSCULAR | Status: DC | PRN
  Administered 2022-12-17 (×2): 5 mg via INTRAVENOUS
  Administered 2022-12-17: 10 mg via INTRAVENOUS

## 2022-12-17 MED ORDER — OXYCODONE HCL 5 MG PO TABS: 5.0000 mg | ORAL_TABLET | ORAL | 0 refills | Status: AC | PRN

## 2022-12-17 MED ORDER — ONDANSETRON 4 MG PO TBDP: 4.0000 mg | ORAL_TABLET | Freq: Three times a day (TID) | ORAL | 0 refills | Status: AC | PRN

## 2022-12-17 MED ORDER — OXYCODONE HCL 5 MG/5ML PO SOLN: 5.0000 mg | Freq: Once | ORAL | Status: AC | PRN

## 2022-12-17 MED ORDER — LIDOCAINE HCL (CARDIAC) PF 100 MG/5ML IV SOSY
PREFILLED_SYRINGE | INTRAVENOUS | Status: DC | PRN
  Administered 2022-12-17: 80 mg via INTRATRACHEAL

## 2022-12-17 MED ORDER — FENTANYL CITRATE (PF) 100 MCG/2ML IJ SOLN
INTRAMUSCULAR | Status: DC | PRN
  Administered 2022-12-17: 25 ug via INTRAVENOUS
  Administered 2022-12-17 (×2): 50 ug via INTRAVENOUS
  Administered 2022-12-17: 25 ug via INTRAVENOUS

## 2022-12-17 SURGICAL SUPPLY — 54 items
ADH SKN CLS APL DERMABOND .7 (GAUZE/BANDAGES/DRESSINGS) ×2
ADPR IRR PORT MULTIBAG TUBE (MISCELLANEOUS) ×2
ANCH SUT 2 SWLK 19.1 CLS EYLT (Anchor) ×2 IMPLANT
ANCH SUT 2.9 PUSHLOCK ANCH (Orthopedic Implant) ×1 IMPLANT
ANCH SUT 5 3.9 CRKSW KNTLS (Anchor) ×1 IMPLANT
ANCHOR 3.9 PEEK CORKSCREW 5MTS (Anchor) IMPLANT
ANCHOR ICONIX SPEED 2.3 (Anchor) IMPLANT
ANCHOR SWIVELOCK BIO 4.75X19.1 (Anchor) IMPLANT
APL PRP STRL LF DISP 70% ISPRP (MISCELLANEOUS) ×1
BLADE SHAVER 4.5X7 STR FR (MISCELLANEOUS) ×1 IMPLANT
BUR BR 5.5 WIDE MOUTH (BURR) ×1 IMPLANT
CANNULA PARTIAL THREAD 2X7 (CANNULA) IMPLANT
CANNULA TWIST IN 8.25X7CM (CANNULA) IMPLANT
CHLORAPREP W/TINT 26 (MISCELLANEOUS) ×1 IMPLANT
COOLER POLAR GLACIER W/PUMP (MISCELLANEOUS) ×1 IMPLANT
COVER LIGHT HANDLE UNIVERSAL (MISCELLANEOUS) ×2 IMPLANT
DERMABOND ADVANCED .7 DNX12 (GAUZE/BANDAGES/DRESSINGS) IMPLANT
DRAPE INCISE IOBAN 66X45 STRL (DRAPES) ×1 IMPLANT
DRAPE U-SHAPE 48X52 POLY STRL (PACKS) ×1 IMPLANT
DRSG TEGADERM 4X10 (GAUZE/BANDAGES/DRESSINGS) IMPLANT
DRSG TEGADERM 4X4.75 (GAUZE/BANDAGES/DRESSINGS) ×3 IMPLANT
ELECT REM PT RETURN 9FT ADLT (ELECTROSURGICAL) ×1
ELECTRODE REM PT RTRN 9FT ADLT (ELECTROSURGICAL) IMPLANT
GAUZE SPONGE 4X4 12PLY STRL (GAUZE/BANDAGES/DRESSINGS) ×1 IMPLANT
GAUZE XEROFORM 1X8 LF (GAUZE/BANDAGES/DRESSINGS) ×1 IMPLANT
GLOVE SRG 8 PF TXTR STRL LF DI (GLOVE) ×3 IMPLANT
GLOVE SURG ENC MOIS LTX SZ7.5 (GLOVE) ×4 IMPLANT
GLOVE SURG UNDER POLY LF SZ8 (GLOVE) ×3
GOWN STRL REIN 2XL XLG LVL4 (GOWN DISPOSABLE) ×1 IMPLANT
GOWN STRL REUS W/ TWL LRG LVL3 (GOWN DISPOSABLE) ×3 IMPLANT
GOWN STRL REUS W/TWL LRG LVL3 (GOWN DISPOSABLE) ×4
IV LACTATED RINGER IRRG 3000ML (IV SOLUTION) ×10
IV LR IRRIG 3000ML ARTHROMATIC (IV SOLUTION) ×6 IMPLANT
KIT CORKSCREW KNTLS 3.9 S/T/P (INSTRUMENTS) IMPLANT
KIT STABILIZATION SHOULDER (MISCELLANEOUS) ×1 IMPLANT
KIT TURNOVER KIT A (KITS) ×1 IMPLANT
MANIFOLD NEPTUNE II (INSTRUMENTS) ×1 IMPLANT
MASK FACE SPIDER DISP (MASK) ×1 IMPLANT
MAT GRAY ABSORB FLUID 28X50 (MISCELLANEOUS) ×2 IMPLANT
PACK ARTHROSCOPY SHOULDER (MISCELLANEOUS) ×1 IMPLANT
PAD ABD DERMACEA PRESS 5X9 (GAUZE/BANDAGES/DRESSINGS) ×2 IMPLANT
PAD WRAPON POLAR SHDR XLG (MISCELLANEOUS) ×1 IMPLANT
PASSER SUT FIRSTPASS SELF (INSTRUMENTS) IMPLANT
SET Y ADAPTER MULIT-BAG IRRIG (MISCELLANEOUS) ×2 IMPLANT
SPONGE T-LAP 18X18 ~~LOC~~+RFID (SPONGE) ×1 IMPLANT
SUT ETHILON 3-0 FS-10 30 BLK (SUTURE) ×2
SUTURE EHLN 3-0 FS-10 30 BLK (SUTURE) ×1 IMPLANT
SYSTEM IMPL TENODESIS LNT 2.9 (Orthopedic Implant) IMPLANT
TAPE MICROFOAM 4IN (TAPE) ×1 IMPLANT
TUBING CONNECTING 10 (TUBING) ×1 IMPLANT
TUBING INFLOW SET DBFLO PUMP (TUBING) ×1 IMPLANT
TUBING OUTFLOW SET DBLFO PUMP (TUBING) ×1 IMPLANT
WAND WEREWOLF FLOW 90D (MISCELLANEOUS) ×1 IMPLANT
WRAPON POLAR PAD SHDR XLG (MISCELLANEOUS) ×1

## 2022-12-17 NOTE — Anesthesia Postprocedure Evaluation (Signed)
Anesthesia Post Note  Patient: MAIREAD ALBO  Procedure(s) Performed: Left shoulder arthroscopic rotator cuff repair (supraspinatus and subscapularis), subacromial decompression, distal clavicle excision, biceps tenodesis, and loose body removal (Left)  Patient location during evaluation: PACU Anesthesia Type: General Level of consciousness: awake and alert Pain management: pain level controlled Vital Signs Assessment: post-procedure vital signs reviewed and stable Respiratory status: spontaneous breathing, nonlabored ventilation, respiratory function stable and patient connected to nasal cannula oxygen Cardiovascular status: blood pressure returned to baseline and stable Postop Assessment: no apparent nausea or vomiting Anesthetic complications: no   No notable events documented.   Last Vitals:  Vitals:   12/17/22 1038 12/17/22 1058  BP:  124/71  Pulse:  91  Resp:  16  Temp:  36.4 C  SpO2: 96% 98%    Last Pain:  Vitals:   12/17/22 1050  TempSrc:   PainSc: 4                  Precious Haws Refugio Vandevoorde

## 2022-12-17 NOTE — Discharge Instructions (Addendum)
Post-Op Instructions - Rotator Cuff Repair  1. Bracing: You will wear a shoulder immobilizer or sling for 6 weeks.   2. Driving: No driving for 3 weeks post-op. When driving, do not wear the immobilizer. Ideally, we recommend no driving for 6 weeks while sling is in place as one arm will be immobilized.   3. Activity: No active lifting for 2 months. Wrist, hand, and elbow motion only. Avoid lifting the upper arm away from the body except for hygiene. You are permitted to bend and straighten the elbow passively only (no active elbow motion). You may use your hand and wrist for typing, writing, and managing utensils (cutting food). Do not lift more than a coffee cup for 8 weeks.  When sleeping or resting, inclined positions (recliner chair or wedge pillow) and a pillow under the forearm for support may provide better comfort for up to 4 weeks.  Avoid long distance travel for 4 weeks.  Return to normal activities after rotator cuff repair repair normally takes 6 months on average. If rehab goes very well, may be able to do most activities at 4 months, except overhead or contact sports.  4. Physical Therapy: Begins 3-4 days after surgery, and proceed 1 time per week for the first 6 weeks, then 1-2 times per week from weeks 6-20 post-op.  5. Medications:  - You will be provided a prescription for narcotic pain medicine. After surgery, take 1-2 narcotic tablets every 4 hours if needed for severe pain.  - A prescription for anti-nausea medication will be provided in case the narcotic medicine causes nausea - take 1 tablet every 6 hours only if nauseated.   - Take tylenol 1000 mg (2 Extra Strength tablets or 3 regular strength) every 8 hours for pain.  May decrease or stop tylenol 5 days after surgery if you are having minimal pain. - Take ASA 325mg/day x 2 weeks to help prevent DVTs/PEs (blood clots).  - DO NOT take ANY nonsteroidal anti-inflammatory pain medications (Advil, Motrin, Ibuprofen, Aleve,  Naproxen, or Naprosyn). These medicines can inhibit healing of your shoulder repair.    If you are taking prescription medication for anxiety, depression, insomnia, muscle spasm, chronic pain, or for attention deficit disorder, you are advised that you are at a higher risk of adverse effects with use of narcotics post-op, including narcotic addiction/dependence, depressed breathing, death. If you use non-prescribed substances: alcohol, marijuana, cocaine, heroin, methamphetamines, etc., you are at a higher risk of adverse effects with use of narcotics post-op, including narcotic addiction/dependence, depressed breathing, death. You are advised that taking > 50 morphine milligram equivalents (MME) of narcotic pain medication per day results in twice the risk of overdose or death. For your prescription provided: oxycodone 5 mg - taking more than 6 tablets per day would result in > 50 morphine milligram equivalents (MME) of narcotic pain medication. Be advised that we will prescribe narcotics short-term, for acute post-operative pain only - 3 weeks for major operations such as shoulder repair/reconstruction surgeries.     6. Post-Op Appointment:  Your first post-op appointment will be 10-14 days post-op.  7. Work or School: For most, but not all procedures, we advise staying out of work or school for at least 1 to 2 weeks in order to recover from the stress of surgery and to allow time for healing.   If you need a work or school note this can be provided.   8. Smoking: If you are a smoker, you need to refrain from   smoking in the postoperative period. The nicotine in cigarettes will inhibit healing of your shoulder repair and decrease the chance of successful repair. Similarly, nicotine containing products (gum, patches) should be avoided.   Post-operative Brace: Apply and remove the brace you received as you were instructed to at the time of fitting and as described in detail as the brace's  instructions for use indicate.  Wear the brace for the period of time prescribed by your physician.  The brace can be cleaned with soap and water and allowed to air dry only.  Should the brace result in increased pain, decreased feeling (numbness/tingling), increased swelling or an overall worsening of your medical condition, please contact your doctor immediately.  If an emergency situation occurs as a result of wearing the brace after normal business hours, please dial 911 and seek immediate medical attention.  Let your doctor know if you have any further questions about the brace issued to you. Refer to the shoulder sling instructions for use if you have any questions regarding the correct fit of your shoulder sling.  [facility]: [phone]  Video that illustrates how to properly use a shoulder sling: "Instructions for Proper Use of an Orthopaedic Sling" [email or web address]        Information for Discharge Teaching: EXPAREL (bupivacaine liposome injectable suspension)   Your surgeon or anesthesiologist gave you EXPAREL(bupivacaine) to help control your pain after surgery.  EXPAREL is a local anesthetic that provides pain relief by numbing the tissue around the surgical site. EXPAREL is designed to release pain medication over time and can control pain for up to 72 hours. Depending on how you respond to EXPAREL, you may require less pain medication during your recovery.  Possible side effects: Temporary loss of sensation or ability to move in the area where bupivacaine was injected. Nausea, vomiting, constipation Rarely, numbness and tingling in your mouth or lips, lightheadedness, or anxiety may occur. Call your doctor right away if you think you may be experiencing any of these sensations, or if you have other questions regarding possible side effects.  Follow all other discharge instructions given to you by your surgeon or nurse.  Eat a healthy diet and drink plenty of water or other fluids.  If you return to the hospital for any reason within 96 hours following the administration of EXPAREL, it is important for health care providers to know that you have received this anesthetic. A teal colored band has been placed on your arm with the date, time and amount of EXPAREL you have received in order to alert and inform your health care providers. Please leave this armband in place for the full 96 hours following administration, and then you may remove the band.Information for Discharge Teaching: EXPAREL (bupivacaine liposome injectable suspension)   Your surgeon or anesthesiologist gave you EXPAREL(bupivacaine) to help control your pain after surgery.  EXPAREL is a local anesthetic that provides pain relief by numbing the tissue around the surgical site. EXPAREL is designed to release pain medication over time and can control pain for up to 72 hours. Depending on how you respond to EXPAREL, you may require less pain medication during your recovery.  Possible side effects: Temporary loss of sensation or ability to move in the area where bupivacaine was injected. Nausea, vomiting, constipation Rarely, numbness and tingling in your mouth or lips, lightheadedness, or anxiety may occur. Call your doctor right away if you think you may be experiencing any of these sensations, or if you have  other questions regarding possible side effects.  Follow all other discharge instructions given to you by your surgeon or nurse. Eat a healthy diet and drink plenty of water or other fluids.  If you return to the hospital for any reason within 96 hours following the administration of EXPAREL, it is important for health care providers to know that you have received this anesthetic. A teal colored band has been placed on your arm with the date, time and amount of EXPAREL you have received in order to alert and inform your health care providers.  Please leave this armband in place for the full 96 hours following administration, and then you may remove the band.  PERIPHERAL NERVE BLOCK PATIENT INFORMATION  Your surgeon has requested a peripheral nerve block for your surgery. This anesthetic technique provides excellent post-operative pain relief for you in a safe and effective manner. It will also help reduce the risk of nausea and vomiting and allow earlier discharge from the hospital.   The block is performed under sedation with ultrasound guidance prior to your procedure. Due to the sedation, your may or may not remember the block experience. The nerve block will begin to take effect anywhere from 5 to 30 minutes after being administered. You will be transported to the operating room from your surgery after the block is completed.   At the end of surgery, when the anesthesia wears off, you will notice a few things. Your may not be able to move or feel the part of your body targeted by the nerve block. These are normal experiences, and they will disappear as the block wears off.  If you had an interscalene nerve block performed (which is common for shoulder surgery), your voice can be very hoarse and you may feel that you are not able to take as deep a breath as you did before surgery. Some patients may also notice a droopy eyelid on the affected side. These symptoms will resolve once the block wears off.  Pain control: The nerve block technique used is a single injection that can last anywhere from 1-3 days. The duration of the numbness can vary between individuals. After leaving the hospital, it is important that you begin to take your prescribed pain medication when you start to sense the nerve block wearing off. This will help you avoid unpleasant pain at the time the nerve block wears off, which can sometimes be in the middle of the night. The block will only cover pain in the areas targeted by the nerve block so if you experience surgical  pain outside of that area, please take your prescribed pain medication. Management of the "numb area": After a nerve block, you cannot feel pain, pressure, or temperature in the affected area so there is an increased risk for injury. You should take extra care to protect the affected areas until sensation and movement returns. Please take caution to not come in contact with extremely hot or cold items because you will not be able to sense or protect yourself form the extremes of temperature.  You may experience some persistent numbness after the procedure by most neurological deficits resolve over time and the incidence of serious long term neurological complications attributable to peripheral nerve blocks are relatively uncommon.    POLAR CARE INFORMATION  http://jones.com/  How to use [facility] Cold Therapy System?  YouTube   BargainHeads[employer]tn  OPERATING INSTRUCTIONS  Start the product With dry hands, connect the transformer to the electrical connection  located on the top of the cooler. Next, plug the transformer into an appropriate electrical outlet. The unit will automatically start running at this point.  To stop the pump, disconnect electrical power.  Unplug to stop the product when not in use. Unplugging the Polar Care unit turns it off. Always unplug immediately after use. Never leave it plugged in while unattended. Remove pad.    FIRST ADD WATER TO FILL LINE, THEN ICE---Replace ice when existing ice is almost melted  1 Discuss Treatment with your [facility] Practitioner and Use Only as Prescribed 2 Apply Insulation Barrier & Cold Therapy Pad 3 Check for Moisture 4 Inspect Skin Regularly  Tips and Trouble Shooting Usage Tips 1. Use cubed or chunked ice for optimal performance. 2. It is recommended to drain the Pad between uses. To drain the pad, hold the Pad upright with the hose pointed toward the ground. Depress the black plunger and  allow water to drain out. 3. You may disconnect the Pad from the unit without removing the pad from the affected area by depressing the silver tabs on the hose coupling and gently pulling the hoses apart. The Pad and unit will seal itself and will not leak. Note: Some dripping during release is normal. 4. DO NOT RUN PUMP WITHOUT WATER! The pump in this unit is designed to run with water. Running the unit without water will cause permanent damage to the pump. 5. Unplug unit before removing lid.  TROUBLESHOOTING GUIDE Pump not running, Water not flowing to the pad, Pad is not getting cold 1. Make sure the transformer is plugged into the wall outlet. 2. Confirm that the ice and water are filled to the indicated levels. 3. Make sure there are no kinks in the pad. 4. Gently pull on the blue tube to make sure the tube/pad junction is straight. 5. Remove the pad from the treatment site and ll it while the pad is lying at; then reapply. 6. Confirm that the pad couplings are securely attached to the unit. Listen for the double clicks (Figure 1) to confirm the pad couplings are securely attached.  Leaks    Note: Some condensation on the lines, controller, and pads is unavoidable, especially in warmer climates. 1. If using a [facility] Polar Care Cold Therapy unit with a detachable Cold Therapy Pad, and a leak exists (other than condensation on the lines) disconnect the pad couplings. Make sure the silver tabs on the couplings are depressed before reconnecting the pad to the pump hose; then confirm both sides of the coupling are properly clicked in. 2. If the coupling continues to leak or a leak is detected in the pad itself, stop using it and call [place] at [phone].  Cleaning After use, empty and dry the unit with a soft cloth. Warm water and mild detergent may be used occasionally to clean the pump and tubes.  WARNING: The [place] can be cold enough to cause serious injury, including  full skin necrosis. Follow these Operating Instructions, and carefully read the Product Insert (see pouch on side of unit) and the Cold Therapy Pad Fitting Instructions (provided with each Cold Therapy Pad) prior to use.

## 2022-12-17 NOTE — Anesthesia Procedure Notes (Signed)
Anesthesia Regional Block: Interscalene brachial plexus block   Pre-Anesthetic Checklist: , timeout performed,  Correct Patient, Correct Site, Correct Laterality,  Correct Procedure, Correct Position, site marked,  Risks and benefits discussed,  Surgical consent,  Pre-op evaluation,  At surgeon's request and post-op pain management  Laterality: Upper and Left  Prep: chloraprep       Needles:  Injection technique: Single-shot  Needle Type: Stimiplex     Needle Length: 9cm  Needle Gauge: 22     Additional Needles:   Procedures:,,,, ultrasound used (permanent image in chart),,    Narrative:  Start time: 12/17/2022 7:30 AM End time: 12/17/2022 7:32 AM Injection made incrementally with aspirations every 5 mL.  Performed by: Personally  Anesthesiologist: Patrcia Schnepp, Precious Haws, MD  Additional Notes: Patient reports baseline numbness in her left hand at baseline preBlock  Patient consented for risk and benefits of nerve block including but not limited to nerve damage, Horner's syndrome, failed block, bleeding and infection.  Patient voiced understanding.  Functioning IV was confirmed and monitors were applied.  Timeout done prior to procedure and prior to any sedation being given to the patient.  Patient confirmed procedure site prior to any sedation given to the patient.  A 55m 22ga Stimuplex needle was used. Sterile prep,hand hygiene and sterile gloves were used.  Minimal sedation used for procedure.  No paresthesia endorsed by patient during the procedure.  Negative aspiration and negative test dose prior to incremental administration of local anesthetic. The patient tolerated the procedure well with no immediate complications.

## 2022-12-17 NOTE — Op Note (Signed)
SURGERY DATE: 12/17/2022   PRE-OP DIAGNOSIS:  1. Left subacromial impingement 2. Left biceps tendinopathy 3. Left rotator cuff tear 4. Left acromioclavicular joint arthritis  POST-OP DIAGNOSIS: 1. Left subacromial impingement 2. Left biceps tendinopathy 3. Left rotator cuff tear 4. Left acromioclavicular joint arthritis  PROCEDURES:  1. Left arthroscopic rotator cuff repair (full-thickness supraspinatus and partial-thickness upper border subscapularis) 2. Left arthroscopic biceps tenodesis 3. Left arthroscopic subacromial decompression 4. Left arthroscopic extensive debridement of shoulder (glenohumeral and subacromial spaces) 5. Left arthroscopic distal clavicle excision  SURGEON: Cato Mulligan, MD   ASSISTANT: Anitra Lauth, PA, Linward Natal, PA-S    ANESTHESIA: Gen with Exparel interscalene block   ESTIMATED BLOOD LOSS: 5cc   DRAINS:  none   TOTAL IV FLUIDS: per anesthesia      SPECIMENS: none   IMPLANTS:  - Arthrex 2.4m PushLock x 1 - Arthrex 3.938mKnotless Corkscrew x 1 - Arthrex 4.7572mwiveLock x 2 - Iconix SPEED double loaded with 1.2 and 2.0mm69mpe x 2     OPERATIVE FINDINGS:  Examination under anesthesia: A careful examination under anesthesia was performed.  Passive range of motion was: FF: 150; ER at side: 45; ER in abduction: 90; IR in abduction: 45.  Anterior load shift: NT.  Posterior load shift: NT.  Sulcus in neutral: NT.  Sulcus in ER: NT.     Intra-operative findings: A thorough arthroscopic examination of the shoulder was performed.  The findings are: 1. Biceps tendon: tendinopathy with significant erythema  2. Superior labrum: erythema 3. Posterior labrum and capsule: normal 4. Inferior capsule and inferior recess: normal 5. Glenoid cartilage surface: Normal 6. Supraspinatus attachment: full-thickness tear of the supraspinatus 7. Posterior rotator cuff attachment: normal 8. Humeral head articular cartilage: normal except small focal area  approximately 5 x 5 mm of grade 2 degenerative change 9. Rotator interval: significant synovitis 10: Subscapularis tendon: Upper border partial-thickness tear 11. Anterior labrum: Mildly degenerative 12. IGHL: normal   OPERATIVE REPORT:    Indications for procedure:  Kathy PRYERa 56 y50. female with approximately 4 months of significantly worsening left shoulder pain.  She has had difficulty with overhead motion since that time with sensations of weakness. Clinical exam and MRI were suggestive of rotator cuff tear, biceps tendinopathy, acromioclavicular joint arthritis and subacromial impingement. After discussion of risks, benefits, and alternatives to surgery, the patient elected to proceed.    Procedure in detail:   I identified FranALIZAYA MENZAthe pre-operative holding area.  I marked the operative shoulder with my initials. I reviewed the risks and benefits of the proposed surgical intervention, and the patient wished to proceed.  Anesthesia was then performed with an Exparel interscalene block.  The patient was transferred to the operative suite and placed in the beach chair position.     Appropriate IV antibiotics were administered prior to incision. The operative upper extremity was then prepped and draped in standard fashion. A time out was performed confirming the correct extremity, correct patient, and correct procedure.    I then created a standard posterior portal with an 11 blade. The glenohumeral joint was easily entered with a blunt trocar and the arthroscope introduced. The findings of diagnostic arthroscopy are described above. I debrided degenerative tissue including the synovitic tissue about the rotator interval and anterior and superior labrum. I then coagulated the inflamed synovium to obtain hemostasis and reduce the risk of post-operative swelling using an Arthrocare radiofrequency device.   I then turned my  attention to the arthroscopic biceps tenodesis. The  Loop n Tack technique was used to pass a FiberTape through the biceps in a locked fashion adjacent to the biceps anchor.  A hole for a 2.9 mm Arthrex PushLock was drilled in the bicipital groove just superior to the subscapularis tendon insertion.  The biceps tendon was then cut and the biceps anchor complex was debrided down to a stable base on the superior labrum.  The FiberTape was loaded onto the PushLock anchor and impacted into place into the previously drilled hole in the bicipital groove.  This appropriately secured the biceps into the bicipital groove and took it off of tension.   Next, arthroscopic repair of the subscapularis was performed. The lesser tuberosity footprint was prepared with a combination of electrocautery and an arthroscopic curette.  An Arthrex knotless corkscrew was placed into the lesser tuberosity footprint from the anterior portal.  A BirdBeak was used to shuttle the repair suture through the upper border of the subscapularis tendon.  The suture was then shuttled through the anchor. With the arm in neutral rotation, the repair was tensioned appropriately. This appropriately reduced the subscapularis tear.  The arm was then internally and externally rotated and the subscapularis was noted to move appropriately with rotation.  The remainder of the suture was then cut.  Next, the arthroscope was then introduced into the subacromial space. A direct lateral portal was created with an 11-blade after spinal needle localization. An extensive subacromial bursectomy and debridement was performed using a combination of the shaver and Arthrocare wand. The entire acromial undersurface was exposed and the CA ligament was subperiosteally elevated to expose the anterior acromial hook. A burr was used to create a flat anterior and lateral aspect of the acromion, converting it from a Type 2 to a Type 1 acromion. Care was made to keep the deltoid fascia intact.   I then turned my attention to the  arthroscopic distal clavicle excision. I identified the acromioclavicular joint. Surrounding bursal tissue was debrided and the edges of the joint were identified. I used the 5.10m barrel burr to remove the distal clavicle parallel to the edge of the acromion. I was able to fit two widths of the burr into the space between the distal clavicle and acromion, signifying that I had removed ~157mof distal clavicle. This was confirmed by viewing anteriorly and introducing a probe with measuring marks from the lateral portal. Hemostasis was achieved with an Arthrocare wand.   Next, I created an accessory posterolateral portal to assist with visualization and instrumentation.  I debrided the poor quality edges of the supraspinatus tendon.  This was a U-shaped tear of the entire supraspinatus.  I prepared the footprint using a burr to expose bleeding bone.     I then percutaneously placed 1 Iconix SPEED medial row anchor along the anterior portion of the tear at the articular margin. Another SPEED anchor was placed along the posterior portion of the tear at the articular margin. I then shuttled all 8 strands of tape through the rotator cuff just lateral to the musculotendinous junction using a FirstPass suture passer spanning the anterior to posterior extent of the tear. The posterior strands of each suture were passed through an Ar[employer]CA Incnchor.  This was placed approximately 2 cm distal to the lateral edge of the footprint in line with the posterior aspect of the tear with appropriate tensioning of each suture prior to final fixation.  Similarly, the anterior strands of each suture were  passed through another SwiveLock anchor along the anterior margin of the tear.  There were 2 small dogears, one anteriorly and 1 posteriorly.  The knotless mechanism of the SwiveLock anchors were utilized to reduce the dogears.  This construct allowed for excellent reapproximation of the rotator cuff to its native footprint  without undue tension.  Appropriate compression was achieved.  The repair was stable to external and internal rotation.   Fluid was evacuated from the shoulder, and the portals were closed with 3-0 Nylon. Xeroform was applied to the portals. A sterile dressing was applied, followed by a Polar Care sleeve and a SlingShot shoulder immobilizer/sling. The patient was awakened from anesthesia without difficulty and was transferred to the PACU in stable condition.    Of note, assistance from a PA was essential to performing the surgery.  PA was present for the entire surgery.  PA assisted with patient positioning, retraction, instrumentation, and wound closure. The surgery would have been more difficult and had longer operative time without PA assistance.   COMPLICATIONS: none   DISPOSITION: plan for discharge home after recovery in PACU     POSTOPERATIVE PLAN: Remain in sling (except hygiene and elbow/wrist/hand RoM exercises as instructed by PT) x 6 weeks and NWB for this time. PT to begin 3-4 days after surgery.  Large rotator cuff repair + subscapularis repair rehab protocol. ASA 357m daily x 2 weeks for DVT ppx.

## 2022-12-17 NOTE — Progress Notes (Signed)
Assisted [place] ANMD with left, interscalene , ultrasound guided block. Side rails up, monitors on throughout procedure. See vital signs in flow sheet. Tolerated Procedure well.

## 2022-12-17 NOTE — Anesthesia Preprocedure Evaluation (Addendum)
Anesthesia Evaluation  Patient identified by MRN, date of birth, ID band Patient awake    Reviewed: Allergy & Precautions, NPO status , Patient's Chart, lab work & pertinent test results  History of Anesthesia Complications Negative for: history of anesthetic complications  Airway Mallampati: III  TM Distance: <3 FB Neck ROM: full    Dental  (+) Chipped   Pulmonary neg shortness of breath, former smoker   Pulmonary exam normal        Cardiovascular Exercise Tolerance: Good (-) angina (-) Past MI negative cardio ROS Normal cardiovascular exam     Neuro/Psych  PSYCHIATRIC DISORDERS      negative neurological ROS     GI/Hepatic negative GI ROS, Neg liver ROS,neg GERD  ,,  Endo/Other  negative endocrine ROS    Renal/GU      Musculoskeletal   Abdominal   Peds  Hematology negative hematology ROS (+)   Anesthesia Other Findings Patient reports some baseline numbness in her left hand preBlock  Past Medical History: No date: Anxiety No date: Depression  Past Surgical History: No date: ABDOMINAL HYSTERECTOMY No date: CESAREAN SECTION     Comment:  x2  BMI    Body Mass Index: 30.66 kg/m      Reproductive/Obstetrics negative OB ROS                             Anesthesia Physical Anesthesia Plan  ASA: 2  Anesthesia Plan: General ETT   Post-op Pain Management: Regional block   Induction: Intravenous  PONV Risk Score and Plan: Ondansetron, Dexamethasone, Midazolam and Treatment may vary due to age or medical condition  Airway Management Planned: Oral ETT  Additional Equipment:   Intra-op Plan:   Post-operative Plan: Extubation in OR  Informed Consent: I have reviewed the patients History and Physical, chart, labs and discussed the procedure including the risks, benefits and alternatives for the proposed anesthesia with the patient or authorized representative who has  indicated his/her understanding and acceptance.     Dental Advisory Given  Plan Discussed with: Anesthesiologist, CRNA and Surgeon  Anesthesia Plan Comments: (Patient consented for risks of anesthesia including but not limited to:  - adverse reactions to medications - damage to eyes, teeth, lips or other oral mucosa - nerve damage due to positioning  - sore throat or hoarseness - Damage to heart, brain, nerves, lungs, other parts of body or loss of life  Patient voiced understanding.)       Anesthesia Quick Evaluation

## 2022-12-17 NOTE — Anesthesia Procedure Notes (Signed)
Procedure Name: Intubation Date/Time: 12/17/2022 7:55 AM  Performed by: Moises Blood, CRNAPre-anesthesia Checklist: Patient identified, Emergency Drugs available, Suction available, Patient being monitored and Timeout performed Patient Re-evaluated:Patient Re-evaluated prior to induction Preoxygenation: Pre-oxygenation with 100% oxygen Induction Type: IV induction Ventilation: Mask ventilation without difficulty Laryngoscope Size: Mac and 3 Grade View: Grade III Tube type: Oral Tube size: 7.0 mm Number of attempts: 1 Airway Equipment and Method: Stylet Placement Confirmation: ETT inserted through vocal cords under direct vision, positive ETCO2, CO2 detector and breath sounds checked- equal and bilateral Secured at: 22 cm Tube secured with: Tape Dental Injury: Teeth and Oropharynx as per pre-operative assessment

## 2022-12-17 NOTE — H&P (Signed)
Paper H&P to be scanned into permanent record. H&P reviewed. No significant changes noted.  

## 2022-12-17 NOTE — Transfer of Care (Addendum)
Immediate Anesthesia Transfer of Care Note  Patient: Kathy Haynes  Procedure(s) Performed: Left shoulder arthroscopic rotator cuff repair (supraspinatus and subscapularis), subacromial decompression, distal clavicle excision, biceps tenodesis, and loose body removal (Left)  Patient Location: PACU  Anesthesia Type: General ETT  Level of Consciousness: awake, alert  and patient cooperative  Airway and Oxygen Therapy: Patient Spontanous Breathing and Patient connected to supplemental oxygen  Post-op Assessment: Post-op Vital signs reviewed, Patient's Cardiovascular Status Stable, Respiratory Function Stable, Patent Airway and No signs of Nausea or vomiting  Post-op Vital Signs: Reviewed and stable  Complications: No notable events documented.

## 2023-12-05 ENCOUNTER — Other Ambulatory Visit: Payer: Self-pay | Admitting: Family Medicine

## 2023-12-05 DIAGNOSIS — R921 Mammographic calcification found on diagnostic imaging of breast: Secondary | ICD-10-CM

## 2023-12-05 DIAGNOSIS — E782 Mixed hyperlipidemia: Secondary | ICD-10-CM

## 2023-12-06 ENCOUNTER — Other Ambulatory Visit: Payer: Self-pay | Admitting: *Deleted

## 2023-12-06 ENCOUNTER — Inpatient Hospital Stay
Admission: RE | Admit: 2023-12-06 | Discharge: 2023-12-06 | Disposition: A | Discharge status: Home / Self Care | Payer: Self-pay | Source: Ambulatory Visit | Attending: Family Medicine | Admitting: Family Medicine

## 2023-12-06 DIAGNOSIS — Z1231 Encounter for screening mammogram for malignant neoplasm of breast: Secondary | ICD-10-CM

## 2023-12-15 ENCOUNTER — Ambulatory Visit
Admission: RE | Admit: 2023-12-15 | Discharge: 2023-12-15 | Disposition: A | Discharge status: Home / Self Care | Payer: 59 | Source: Ambulatory Visit | Attending: Family Medicine | Admitting: Family Medicine

## 2023-12-15 DIAGNOSIS — E782 Mixed hyperlipidemia: Secondary | ICD-10-CM | POA: Diagnosis present

## 2023-12-15 DIAGNOSIS — R921 Mammographic calcification found on diagnostic imaging of breast: Secondary | ICD-10-CM

## 2023-12-20 ENCOUNTER — Other Ambulatory Visit: Payer: Self-pay | Admitting: Family Medicine

## 2023-12-20 DIAGNOSIS — R921 Mammographic calcification found on diagnostic imaging of breast: Secondary | ICD-10-CM

## 2024-06-07 ENCOUNTER — Other Ambulatory Visit: Payer: Self-pay | Admitting: Medical Genetics

## 2024-06-14 ENCOUNTER — Ambulatory Visit
Admission: RE | Admit: 2024-06-14 | Discharge: 2024-06-14 | Disposition: A | Discharge status: Home / Self Care | Payer: Self-pay | Source: Ambulatory Visit | Attending: Family Medicine | Admitting: Family Medicine

## 2024-06-14 ENCOUNTER — Other Ambulatory Visit
Admission: RE | Admit: 2024-06-14 | Discharge: 2024-06-14 | Disposition: A | Discharge status: Home / Self Care | Payer: Self-pay | Source: Ambulatory Visit | Attending: Medical Genetics | Admitting: Medical Genetics

## 2024-06-14 DIAGNOSIS — R921 Mammographic calcification found on diagnostic imaging of breast: Secondary | ICD-10-CM | POA: Insufficient documentation

## 2024-06-25 LAB — GENECONNECT MOLECULAR SCREEN: Genetic Analysis Overall Interpretation: NEGATIVE

## 2024-07-16 ENCOUNTER — Other Ambulatory Visit: Payer: Self-pay | Admitting: Family Medicine

## 2024-07-16 DIAGNOSIS — R748 Abnormal levels of other serum enzymes: Secondary | ICD-10-CM

## 2024-07-18 ENCOUNTER — Ambulatory Visit
Admission: RE | Admit: 2024-07-18 | Discharge: 2024-07-18 | Disposition: A | Discharge status: Home / Self Care | Source: Ambulatory Visit | Attending: Family Medicine | Admitting: Family Medicine

## 2024-07-18 DIAGNOSIS — R748 Abnormal levels of other serum enzymes: Secondary | ICD-10-CM
